# Patient Record
Sex: Female | Born: 1975 | Race: White | Hispanic: No | Marital: Single | Smoking: Current every day smoker
Health system: Southern US, Community
[De-identification: ages and names within clinical notes are randomized; demographics above are authoritative.]

## PROBLEM LIST (undated history)

## (undated) DIAGNOSIS — F419 Anxiety disorder, unspecified: Secondary | ICD-10-CM

## (undated) DIAGNOSIS — F32A Depression, unspecified: Secondary | ICD-10-CM

## (undated) DIAGNOSIS — G43909 Migraine, unspecified, not intractable, without status migrainosus: Secondary | ICD-10-CM

## (undated) DIAGNOSIS — IMO0001 Reserved for inherently not codable concepts without codable children: Secondary | ICD-10-CM

## (undated) HISTORY — DX: Anxiety disorder, unspecified: F41.9

## (undated) HISTORY — DX: Migraine, unspecified, not intractable, without status migrainosus: G43.909

## (undated) HISTORY — PX: TONSILLECTOMY: SUR1361

## (undated) HISTORY — DX: Depression, unspecified: F32.A

---

## 1998-03-25 ENCOUNTER — Other Ambulatory Visit: Admission: RE | Admit: 1998-03-25 | Discharge: 1998-03-25 | Payer: Self-pay | Admitting: Obstetrics

## 2000-07-02 ENCOUNTER — Encounter: Admission: RE | Admit: 2000-07-02 | Discharge: 2000-07-02 | Payer: Self-pay | Admitting: Family Medicine

## 2000-07-02 ENCOUNTER — Encounter: Payer: Self-pay | Admitting: Family Medicine

## 2000-07-17 ENCOUNTER — Encounter: Admission: RE | Admit: 2000-07-17 | Discharge: 2000-07-17 | Payer: Self-pay | Admitting: Family Medicine

## 2000-07-17 ENCOUNTER — Encounter: Payer: Self-pay | Admitting: Family Medicine

## 2001-01-28 ENCOUNTER — Other Ambulatory Visit: Admission: RE | Admit: 2001-01-28 | Discharge: 2001-01-28 | Payer: Self-pay | Admitting: Obstetrics

## 2002-02-22 ENCOUNTER — Encounter: Payer: Self-pay | Admitting: Emergency Medicine

## 2002-02-22 ENCOUNTER — Emergency Department (HOSPITAL_COMMUNITY): Admission: EM | Admit: 2002-02-22 | Discharge: 2002-02-22 | Payer: Self-pay | Admitting: Emergency Medicine

## 2002-04-23 ENCOUNTER — Encounter: Admission: RE | Admit: 2002-04-23 | Discharge: 2002-04-23 | Payer: Self-pay | Admitting: *Deleted

## 2002-05-06 ENCOUNTER — Emergency Department (HOSPITAL_COMMUNITY): Admission: EM | Admit: 2002-05-06 | Discharge: 2002-05-06 | Payer: Self-pay | Admitting: Emergency Medicine

## 2002-05-30 ENCOUNTER — Encounter: Admission: RE | Admit: 2002-05-30 | Discharge: 2002-05-30 | Payer: Self-pay | Admitting: Psychiatry

## 2002-06-04 ENCOUNTER — Encounter: Admission: RE | Admit: 2002-06-04 | Discharge: 2002-06-04 | Payer: Self-pay | Admitting: *Deleted

## 2002-06-27 ENCOUNTER — Encounter: Admission: RE | Admit: 2002-06-27 | Discharge: 2002-06-27 | Payer: Self-pay | Admitting: *Deleted

## 2002-07-23 ENCOUNTER — Encounter: Admission: RE | Admit: 2002-07-23 | Discharge: 2002-07-23 | Payer: Self-pay | Admitting: *Deleted

## 2002-08-11 ENCOUNTER — Encounter: Admission: RE | Admit: 2002-08-11 | Discharge: 2002-08-11 | Payer: Self-pay | Admitting: *Deleted

## 2002-08-26 ENCOUNTER — Encounter: Admission: RE | Admit: 2002-08-26 | Discharge: 2002-08-26 | Payer: Self-pay | Admitting: *Deleted

## 2002-08-29 ENCOUNTER — Encounter: Admission: RE | Admit: 2002-08-29 | Discharge: 2002-08-29 | Payer: Self-pay | Admitting: *Deleted

## 2002-11-05 ENCOUNTER — Encounter: Admission: RE | Admit: 2002-11-05 | Discharge: 2002-11-05 | Payer: Self-pay | Admitting: *Deleted

## 2002-12-11 ENCOUNTER — Encounter: Admission: RE | Admit: 2002-12-11 | Discharge: 2002-12-11 | Payer: Self-pay | Admitting: *Deleted

## 2003-01-27 ENCOUNTER — Encounter: Admission: RE | Admit: 2003-01-27 | Discharge: 2003-01-27 | Payer: Self-pay | Admitting: *Deleted

## 2003-03-10 ENCOUNTER — Emergency Department (HOSPITAL_COMMUNITY): Admission: EM | Admit: 2003-03-10 | Discharge: 2003-03-10 | Payer: Self-pay | Admitting: Emergency Medicine

## 2003-03-27 ENCOUNTER — Encounter: Admission: RE | Admit: 2003-03-27 | Discharge: 2003-03-27 | Payer: Self-pay | Admitting: *Deleted

## 2003-07-30 ENCOUNTER — Encounter: Admission: RE | Admit: 2003-07-30 | Discharge: 2003-07-30 | Payer: Self-pay | Admitting: *Deleted

## 2003-10-03 ENCOUNTER — Emergency Department (HOSPITAL_COMMUNITY): Admission: AD | Admit: 2003-10-03 | Discharge: 2003-10-03 | Payer: Self-pay | Admitting: Family Medicine

## 2004-03-11 ENCOUNTER — Emergency Department (HOSPITAL_COMMUNITY): Admission: EM | Admit: 2004-03-11 | Discharge: 2004-03-11 | Payer: Self-pay | Admitting: Emergency Medicine

## 2004-07-20 ENCOUNTER — Emergency Department (HOSPITAL_COMMUNITY): Admission: EM | Admit: 2004-07-20 | Discharge: 2004-07-20 | Payer: Self-pay | Admitting: Emergency Medicine

## 2004-09-28 ENCOUNTER — Emergency Department (HOSPITAL_COMMUNITY): Admission: EM | Admit: 2004-09-28 | Discharge: 2004-09-28 | Payer: Self-pay | Admitting: Emergency Medicine

## 2005-05-20 ENCOUNTER — Emergency Department (HOSPITAL_COMMUNITY): Admission: EM | Admit: 2005-05-20 | Discharge: 2005-05-20 | Payer: Self-pay | Admitting: Emergency Medicine

## 2005-07-07 ENCOUNTER — Emergency Department (HOSPITAL_COMMUNITY): Admission: EM | Admit: 2005-07-07 | Discharge: 2005-07-07 | Payer: Self-pay | Admitting: Emergency Medicine

## 2005-08-14 ENCOUNTER — Emergency Department (HOSPITAL_COMMUNITY): Admission: EM | Admit: 2005-08-14 | Discharge: 2005-08-14 | Payer: Self-pay | Admitting: Emergency Medicine

## 2005-08-29 ENCOUNTER — Emergency Department (HOSPITAL_COMMUNITY): Admission: EM | Admit: 2005-08-29 | Discharge: 2005-08-29 | Payer: Self-pay | Admitting: Emergency Medicine

## 2007-02-09 ENCOUNTER — Inpatient Hospital Stay (HOSPITAL_COMMUNITY): Admission: AD | Admit: 2007-02-09 | Discharge: 2007-02-09 | Payer: Self-pay | Admitting: Obstetrics

## 2007-04-30 ENCOUNTER — Ambulatory Visit (HOSPITAL_COMMUNITY): Admission: RE | Admit: 2007-04-30 | Discharge: 2007-04-30 | Payer: Self-pay | Admitting: Obstetrics

## 2007-05-19 ENCOUNTER — Inpatient Hospital Stay (HOSPITAL_COMMUNITY): Admission: AD | Admit: 2007-05-19 | Discharge: 2007-05-19 | Payer: Self-pay | Admitting: Obstetrics

## 2007-06-06 ENCOUNTER — Inpatient Hospital Stay (HOSPITAL_COMMUNITY): Admission: AD | Admit: 2007-06-06 | Discharge: 2007-06-06 | Payer: Self-pay | Admitting: Obstetrics

## 2007-06-20 ENCOUNTER — Inpatient Hospital Stay (HOSPITAL_COMMUNITY): Admission: AD | Admit: 2007-06-20 | Discharge: 2007-06-20 | Payer: Self-pay | Admitting: Obstetrics

## 2007-09-13 ENCOUNTER — Inpatient Hospital Stay (HOSPITAL_COMMUNITY): Admission: AD | Admit: 2007-09-13 | Discharge: 2007-09-13 | Payer: Self-pay | Admitting: Obstetrics

## 2007-09-14 ENCOUNTER — Inpatient Hospital Stay (HOSPITAL_COMMUNITY): Admission: AD | Admit: 2007-09-14 | Discharge: 2007-09-16 | Payer: Self-pay | Admitting: Obstetrics

## 2007-10-25 ENCOUNTER — Emergency Department (HOSPITAL_COMMUNITY): Admission: EM | Admit: 2007-10-25 | Discharge: 2007-10-25 | Payer: Self-pay | Admitting: Emergency Medicine

## 2009-10-13 ENCOUNTER — Emergency Department (HOSPITAL_COMMUNITY): Admission: EM | Admit: 2009-10-13 | Discharge: 2009-10-13 | Payer: Self-pay | Admitting: Emergency Medicine

## 2011-04-29 ENCOUNTER — Emergency Department (HOSPITAL_COMMUNITY)
Admission: EM | Admit: 2011-04-29 | Discharge: 2011-04-29 | Disposition: A | Discharge status: Home / Self Care | Payer: Self-pay | Attending: Emergency Medicine | Admitting: Emergency Medicine

## 2011-04-29 DIAGNOSIS — X503XXA Overexertion from repetitive movements, initial encounter: Secondary | ICD-10-CM | POA: Insufficient documentation

## 2011-04-29 DIAGNOSIS — M25519 Pain in unspecified shoulder: Secondary | ICD-10-CM | POA: Insufficient documentation

## 2011-04-29 DIAGNOSIS — R209 Unspecified disturbances of skin sensation: Secondary | ICD-10-CM | POA: Insufficient documentation

## 2011-04-29 DIAGNOSIS — M545 Low back pain, unspecified: Secondary | ICD-10-CM | POA: Insufficient documentation

## 2011-04-29 DIAGNOSIS — S335XXA Sprain of ligaments of lumbar spine, initial encounter: Secondary | ICD-10-CM | POA: Insufficient documentation

## 2011-04-29 DIAGNOSIS — Y990 Civilian activity done for income or pay: Secondary | ICD-10-CM | POA: Insufficient documentation

## 2011-04-29 DIAGNOSIS — IMO0002 Reserved for concepts with insufficient information to code with codable children: Secondary | ICD-10-CM | POA: Insufficient documentation

## 2011-04-29 DIAGNOSIS — R079 Chest pain, unspecified: Secondary | ICD-10-CM | POA: Insufficient documentation

## 2011-04-29 DIAGNOSIS — M542 Cervicalgia: Secondary | ICD-10-CM | POA: Insufficient documentation

## 2011-05-18 ENCOUNTER — Emergency Department (HOSPITAL_COMMUNITY): Payer: Self-pay

## 2011-05-18 ENCOUNTER — Emergency Department (HOSPITAL_COMMUNITY)
Admission: EM | Admit: 2011-05-18 | Discharge: 2011-05-18 | Disposition: A | Discharge status: Home / Self Care | Payer: Self-pay | Attending: Emergency Medicine | Admitting: Emergency Medicine

## 2011-05-18 DIAGNOSIS — M545 Low back pain, unspecified: Secondary | ICD-10-CM | POA: Insufficient documentation

## 2011-05-18 DIAGNOSIS — M5430 Sciatica, unspecified side: Secondary | ICD-10-CM | POA: Insufficient documentation

## 2011-05-18 DIAGNOSIS — M79609 Pain in unspecified limb: Secondary | ICD-10-CM | POA: Insufficient documentation

## 2011-05-18 DIAGNOSIS — IMO0002 Reserved for concepts with insufficient information to code with codable children: Secondary | ICD-10-CM | POA: Insufficient documentation

## 2011-06-09 ENCOUNTER — Emergency Department (HOSPITAL_COMMUNITY)
Admission: EM | Admit: 2011-06-09 | Discharge: 2011-06-09 | Disposition: A | Discharge status: Home / Self Care | Payer: Worker's Compensation | Attending: Emergency Medicine | Admitting: Emergency Medicine

## 2011-06-09 DIAGNOSIS — Y9289 Other specified places as the place of occurrence of the external cause: Secondary | ICD-10-CM | POA: Insufficient documentation

## 2011-06-09 DIAGNOSIS — S239XXA Sprain of unspecified parts of thorax, initial encounter: Secondary | ICD-10-CM | POA: Insufficient documentation

## 2011-06-09 DIAGNOSIS — M546 Pain in thoracic spine: Secondary | ICD-10-CM | POA: Insufficient documentation

## 2011-06-09 DIAGNOSIS — X500XXA Overexertion from strenuous movement or load, initial encounter: Secondary | ICD-10-CM | POA: Insufficient documentation

## 2011-08-11 LAB — I-STAT 8, (EC8 V) (CONVERTED LAB)
Acid-base deficit: 1
BUN: 3 — ABNORMAL LOW
Bicarbonate: 24.2 — ABNORMAL HIGH
Chloride: 107
Glucose, Bld: 75
HCT: 38
Hemoglobin: 12.9
Operator id: 161631
Potassium: 4.2
Sodium: 136
TCO2: 25
pCO2, Ven: 39.5 — ABNORMAL LOW
pH, Ven: 7.394 — ABNORMAL HIGH

## 2011-08-11 LAB — DIFFERENTIAL
Basophils Absolute: 0.1
Basophils Relative: 1
Eosinophils Absolute: 0.1 — ABNORMAL LOW
Eosinophils Relative: 1
Lymphocytes Relative: 32
Lymphs Abs: 3.3
Monocytes Absolute: 0.7
Monocytes Relative: 7
Neutro Abs: 6
Neutrophils Relative %: 59

## 2011-08-11 LAB — POCT I-STAT CREATININE
Creatinine, Ser: 0.8
Operator id: 161631

## 2011-08-11 LAB — CBC
HCT: 36
Hemoglobin: 12.3
MCHC: 34.1
MCV: 94.7
Platelets: 411 — ABNORMAL HIGH
RBC: 3.8 — ABNORMAL LOW
RDW: 14.1
WBC: 10.2

## 2011-08-15 LAB — CBC
HCT: 26.3 — ABNORMAL LOW
HCT: 26.9 — ABNORMAL LOW
Hemoglobin: 9.5 — ABNORMAL LOW
Hemoglobin: 9.6 — ABNORMAL LOW
MCHC: 35.8
MCHC: 36.2 — ABNORMAL HIGH
MCV: 95.9
MCV: 96.8
Platelets: 279
Platelets: 290
RBC: 2.75 — ABNORMAL LOW
RBC: 2.78 — ABNORMAL LOW
RDW: 13.4
RDW: 13.7
WBC: 29.6 — ABNORMAL HIGH
WBC: 34.1 — ABNORMAL HIGH

## 2011-08-15 LAB — RPR: RPR Ser Ql: NONREACTIVE

## 2011-08-21 LAB — URINALYSIS, ROUTINE W REFLEX MICROSCOPIC
Bilirubin Urine: NEGATIVE
Glucose, UA: NEGATIVE
Hgb urine dipstick: NEGATIVE
Ketones, ur: NEGATIVE
Nitrite: NEGATIVE
Protein, ur: NEGATIVE
Specific Gravity, Urine: <1.005 — ABNORMAL LOW
Urobilinogen, UA: 0.2
pH: 6

## 2011-08-22 LAB — URINALYSIS, ROUTINE W REFLEX MICROSCOPIC
Bilirubin Urine: NEGATIVE
Glucose, UA: NEGATIVE
Hgb urine dipstick: NEGATIVE
Ketones, ur: NEGATIVE
Nitrite: NEGATIVE
Protein, ur: NEGATIVE
Specific Gravity, Urine: <1.005 — ABNORMAL LOW
Urobilinogen, UA: 0.2
pH: 5.5

## 2011-08-22 LAB — URINE MICROSCOPIC-ADD ON

## 2011-11-24 ENCOUNTER — Emergency Department (HOSPITAL_COMMUNITY)
Admission: EM | Admit: 2011-11-24 | Discharge: 2011-11-24 | Disposition: A | Discharge status: Home / Self Care | Payer: Worker's Compensation | Attending: Emergency Medicine | Admitting: Emergency Medicine

## 2011-11-24 ENCOUNTER — Encounter (HOSPITAL_COMMUNITY): Payer: Self-pay | Admitting: *Deleted

## 2011-11-24 DIAGNOSIS — H5710 Ocular pain, unspecified eye: Secondary | ICD-10-CM | POA: Insufficient documentation

## 2011-11-24 DIAGNOSIS — H11419 Vascular abnormalities of conjunctiva, unspecified eye: Secondary | ICD-10-CM | POA: Insufficient documentation

## 2011-11-24 DIAGNOSIS — S058X9A Other injuries of unspecified eye and orbit, initial encounter: Secondary | ICD-10-CM | POA: Insufficient documentation

## 2011-11-24 DIAGNOSIS — IMO0002 Reserved for concepts with insufficient information to code with codable children: Secondary | ICD-10-CM | POA: Insufficient documentation

## 2011-11-24 DIAGNOSIS — H53149 Visual discomfort, unspecified: Secondary | ICD-10-CM | POA: Insufficient documentation

## 2011-11-24 DIAGNOSIS — S0502XA Injury of conjunctiva and corneal abrasion without foreign body, left eye, initial encounter: Secondary | ICD-10-CM

## 2011-11-24 DIAGNOSIS — H5789 Other specified disorders of eye and adnexa: Secondary | ICD-10-CM | POA: Insufficient documentation

## 2011-11-24 MED ORDER — FLUORESCEIN SODIUM 1 MG OP STRP
1.0000 | ORAL_STRIP | Freq: Once | OPHTHALMIC | Status: AC
  Administered 2011-11-24: 1 via OPHTHALMIC
  Filled 2011-11-24 (×2): qty 1

## 2011-11-24 MED ORDER — PROPARACAINE HCL 0.5 % OP SOLN
1.0000 [drp] | OPHTHALMIC | Status: AC
  Administered 2011-11-24: 1 [drp] via OPHTHALMIC
  Filled 2011-11-24 (×2): qty 15

## 2011-11-24 MED ORDER — TOBRAMYCIN 0.3 % OP SOLN: 1.0000 [drp] | OPHTHALMIC | Status: AC

## 2011-11-24 MED ORDER — OXYCODONE-ACETAMINOPHEN 5-325 MG PO TABS: 1.0000 | ORAL_TABLET | ORAL | Status: AC | PRN

## 2011-11-24 NOTE — ED Notes (Signed)
Pt reports accidentally poked self in left eye yesterday. Reports redness, pain, swelling, and increase in tears.

## 2011-11-24 NOTE — ED Notes (Addendum)
Poke self in eye accidentally when washing face. There is swelling, redness to rt. Eye. Cornea irritation and redness.

## 2011-12-01 NOTE — ED Provider Notes (Signed)
History    36 year old female with right eye pain. Acute onset when she accidentally hit herself in the eye with her hand when she was washing her face. Persistent pain since. The pain is increased when looking at bright lights. Persistent tearing. No other drainage noted. Does not use corrective lenses. No fever or chills. No other complaints.  CSN: [id number]  Arrival date & time 11/24/11  1226   First MD Initiated Contact with Patient 11/24/11 1242      Chief Complaint  Patient presents with  . Conjunctivitis    (Consider location/radiation/quality/duration/timing/severity/associated sxs/prior treatment) HPI  No past medical history on file.  No past surgical history on file.  No family history on file.  History  Substance Use Topics  . Smoking status: Current Everyday Smoker  . Smokeless tobacco: Not on file  . Alcohol Use: Yes    OB History    Grav Para Term Preterm Abortions TAB SAB Ect Mult Living                  Review of Systems   Review of symptoms negative unless otherwise noted in HPI.   Allergies  Review of patient's allergies indicates no known allergies.  Home Medications   Current Outpatient Rx  Name Route Sig Dispense Refill  . OXYCODONE-ACETAMINOPHEN 5-325 MG PO TABS Oral Take 1 tablet by mouth every 4 (four) hours as needed for pain. 10 tablet 0  . TOBRAMYCIN SULFATE 0.3 % OP SOLN Left Eye Place 1 drop into the left eye every 2 (two) hours. 5 mL 0    1-2 drops in L eye every 2 hours while awake for 5 ...    BP 117/71  Pulse 77  Temp(Src) 98 F (36.7 C) (Oral)  Resp 18  Ht 5\' 1"  (1.549 m)  Wt 118 lb (53.524 kg)  BMI 22.30 kg/m2  SpO2 100%  LMP 11/09/2011  Physical Exam  Nursing note and vitals reviewed. Constitutional: She appears well-developed and well-nourished. No distress.       Sitting up in bed. hand over her right eye.  HENT:  Head: Normocephalic and atraumatic.  Eyes:       Right eye with injected conjunctiva. Cornea  is clear. There is a corneal abrasion identified with fluoroscein staining. The abrasion is located at approximately the 4:00 position. Negative Seidel test. Patient's pain was relieved with proparacaine. There is no proptosis. Tearing. No purulent discharge. Photophobia. Extraocular muscles are intact. The patient to eye lids and lashes are unremarkable. Periorbital tissues are unremarkable. Left eye within normal limits.  Neck: Neck supple.  Cardiovascular: Normal rate, regular rhythm and normal heart sounds.  Exam reveals no gallop and no friction rub.   No murmur heard. Pulmonary/Chest: Effort normal and breath sounds normal. No respiratory distress.  Musculoskeletal: She exhibits no tenderness.  Neurological: She is alert.  Skin: Skin is warm and dry.  Psychiatric: She has a normal mood and affect. Her behavior is normal. Thought content normal.    ED Course  Procedures (including critical care time)  Labs Reviewed - No data to display No results found.   1. Corneal abrasion, left       MDM  36 year old female with right eye pain. Corneal abrasion on exam. Negative Seidel's test. No evidence of secondary infection. Patient does not wear corrective lenses. Plan ophthalmic antibiotics. As needed pain medication. Ophthalmology followup. Return precautions discussed.        Raeford Razor, MD 12/01/11 [phone]

## 2012-01-10 ENCOUNTER — Encounter (HOSPITAL_COMMUNITY): Payer: Self-pay | Admitting: Emergency Medicine

## 2012-01-10 ENCOUNTER — Emergency Department (HOSPITAL_COMMUNITY)
Admission: EM | Admit: 2012-01-10 | Discharge: 2012-01-10 | Disposition: A | Discharge status: Home / Self Care | Payer: Self-pay | Attending: Emergency Medicine | Admitting: Emergency Medicine

## 2012-01-10 DIAGNOSIS — F17200 Nicotine dependence, unspecified, uncomplicated: Secondary | ICD-10-CM | POA: Insufficient documentation

## 2012-01-10 DIAGNOSIS — M778 Other enthesopathies, not elsewhere classified: Secondary | ICD-10-CM

## 2012-01-10 DIAGNOSIS — M65839 Other synovitis and tenosynovitis, unspecified forearm: Secondary | ICD-10-CM | POA: Insufficient documentation

## 2012-01-10 DIAGNOSIS — M65849 Other synovitis and tenosynovitis, unspecified hand: Secondary | ICD-10-CM | POA: Insufficient documentation

## 2012-01-10 DIAGNOSIS — IMO0001 Reserved for inherently not codable concepts without codable children: Secondary | ICD-10-CM | POA: Insufficient documentation

## 2012-01-10 HISTORY — DX: Reserved for inherently not codable concepts without codable children: IMO0001

## 2012-01-10 MED ORDER — PREDNISONE 20 MG PO TABS: 40.0000 mg | ORAL_TABLET | Freq: Once | ORAL | Status: AC

## 2012-01-10 MED ORDER — PREDNISONE 20 MG PO TABS
40.0000 mg | ORAL_TABLET | Freq: Once | ORAL | Status: AC
  Administered 2012-01-10: 40 mg via ORAL
  Filled 2012-01-10: qty 2

## 2012-01-10 NOTE — ED Provider Notes (Signed)
History     CSN: [id number]  Arrival date & time 01/10/12  2049   First MD Initiated Contact with Patient 01/10/12 2108      Chief Complaint  Patient presents with  . Wrist Pain    (Consider location/radiation/quality/duration/timing/severity/associated sxs/prior treatment) HPI Comments: Patient is having pain in the right thumb with movement.  It radiates to the mid forearm.  She is right-handed.  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  The past several days.  She has been wearing an Ace wrap without much relief  Patient is a 36 y.o. female presenting with wrist pain. The history is provided by the patient.  Wrist Pain This is a recurrent problem. The current episode started in the past 7 days. The problem occurs constantly. The problem has been gradually worsening. Pertinent negatives include no arthralgias, joint swelling or rash.    Past Medical History  Diagnosis Date  . No significant past medical history     History reviewed. No pertinent past surgical history.  History reviewed. No pertinent family history.  History  Substance Use Topics  . Smoking status: Current Everyday Smoker  . Smokeless tobacco: Not on file  . Alcohol Use: Yes    OB History    Grav Para Term Preterm Abortions TAB SAB Ect Mult Living                  Review of Systems  Musculoskeletal: Negative for joint swelling and arthralgias.  Skin: Negative for rash and wound.    Allergies  Review of patient's allergies indicates no known allergies.  Home Medications   Current Outpatient Rx  Name Route Sig Dispense Refill  . PREDNISONE 20 MG PO TABS Oral Take 2 tablets (40 mg total) by mouth once. 20 tablet 0    BP 106/55  Pulse 86  Temp(Src) 98.3 F (36.8 C) (Oral)  Resp 20  SpO2 100%  LMP 01/06/2012  Physical Exam  Constitutional: She is oriented to person, place, and time. She appears well-developed and  well-nourished.  HENT:  Head: Normocephalic.  Neck: Normal range of motion.  Cardiovascular: Normal rate.   Musculoskeletal:       Arms: Neurological: She is alert and oriented to person, place, and time.  Skin: Skin is warm.    ED Course  Procedures (including critical care time)  Labs Reviewed - No data to display No results found.   1. Tendinitis of thumb       MDM  From the exam it appears to be tendinitis.  We'll place in a Velcro thumb spica and a short course of steroids 40 mg daily for 10 days.  Patient sees no significant improvement.  Have referred her to Dr. Park Pope for further evaluation after the ten-day trial.        Arman Filter, NP 01/10/12 2200  Arman Filter, NP 01/10/12 2201

## 2012-01-10 NOTE — Discharge Instructions (Signed)
As discussed wear the splint as much as she can for the next [redacted] weeks along with taking the steroids as directed once daily if you do not see significant decrease in your pain please call Dr. Ronie Spies office and make an appointment for further evaluation

## 2012-01-10 NOTE — ED Notes (Signed)
Patient complaining of right wrist pain since Tuesday; patient states that she uses her hand a lot at work; denies injury to wrist.  Patient states that pain radiates up her arm; reports history of carpel tunnel.

## 2012-01-10 NOTE — Progress Notes (Signed)
Orthopedic Tech Progress Note Patient Details:  Heather Cuevas 1976/10/08 [id number]  Type of Splint: Thumb velcro Splint Location: (R) UE Splint Interventions: Application    Jennye Moccasin 01/10/2012, 10:02 PM

## 2012-01-11 NOTE — ED Provider Notes (Signed)
Medical screening examination/treatment/procedure(s) were performed by non-physician practitioner and as supervising physician I was immediately available for consultation/collaboration.   Laray Anger, DO 01/11/12 1400

## 2014-04-08 ENCOUNTER — Encounter (HOSPITAL_COMMUNITY): Payer: Self-pay | Admitting: Emergency Medicine

## 2014-04-08 ENCOUNTER — Emergency Department (HOSPITAL_COMMUNITY)
Admission: EM | Admit: 2014-04-08 | Discharge: 2014-04-08 | Disposition: A | Discharge status: Home / Self Care | Payer: Managed Care, Other (non HMO) | Attending: Emergency Medicine | Admitting: Emergency Medicine

## 2014-04-08 DIAGNOSIS — Z3202 Encounter for pregnancy test, result negative: Secondary | ICD-10-CM | POA: Insufficient documentation

## 2014-04-08 DIAGNOSIS — Z79899 Other long term (current) drug therapy: Secondary | ICD-10-CM | POA: Insufficient documentation

## 2014-04-08 DIAGNOSIS — G43909 Migraine, unspecified, not intractable, without status migrainosus: Secondary | ICD-10-CM | POA: Insufficient documentation

## 2014-04-08 DIAGNOSIS — F17200 Nicotine dependence, unspecified, uncomplicated: Secondary | ICD-10-CM | POA: Insufficient documentation

## 2014-04-08 LAB — URINALYSIS, ROUTINE W REFLEX MICROSCOPIC
Bilirubin Urine: NEGATIVE
Glucose, UA: NEGATIVE mg/dL
Hgb urine dipstick: NEGATIVE
Ketones, ur: NEGATIVE mg/dL
Leukocytes, UA: NEGATIVE
Nitrite: NEGATIVE
Protein, ur: NEGATIVE mg/dL
Specific Gravity, Urine: 1.008 (ref 1.005–1.030)
Urobilinogen, UA: 0.2 mg/dL (ref 0.0–1.0)
pH: 6 (ref 5.0–8.0)

## 2014-04-08 LAB — PREGNANCY, URINE: Preg Test, Ur: NEGATIVE

## 2014-04-08 MED ORDER — METOCLOPRAMIDE HCL 5 MG/ML IJ SOLN
10.0000 mg | Freq: Once | INTRAMUSCULAR | Status: AC
  Administered 2014-04-08: 10 mg via INTRAMUSCULAR
  Filled 2014-04-08: qty 2

## 2014-04-08 MED ORDER — KETOROLAC TROMETHAMINE 60 MG/2ML IM SOLN
60.0000 mg | Freq: Once | INTRAMUSCULAR | Status: AC
  Administered 2014-04-08: 60 mg via INTRAMUSCULAR
  Filled 2014-04-08: qty 2

## 2014-04-08 MED ORDER — DIPHENHYDRAMINE HCL 25 MG PO CAPS
25.0000 mg | ORAL_CAPSULE | Freq: Once | ORAL | Status: AC
  Administered 2014-04-08: 25 mg via ORAL
  Filled 2014-04-08: qty 1

## 2014-04-08 MED ORDER — BUTALBITAL-APAP-CAFFEINE 50-325-40 MG PO TABS: 1.0000 | ORAL_TABLET | Freq: Four times a day (QID) | ORAL | Status: AC | PRN

## 2014-04-08 NOTE — ED Notes (Signed)
The pt has had as headache since Saturday and some back pain today.  lmp may 15th

## 2014-04-08 NOTE — Discharge Instructions (Signed)
Migraine Headache A migraine headache is an intense, throbbing pain on one or both sides of your head. A migraine can last for 30 minutes to several hours. CAUSES  The exact cause of a migraine headache is not always known. However, a migraine may be caused when nerves in the brain become irritated and release chemicals that cause inflammation. This causes pain. Certain things may also trigger migraines, such as:  Alcohol.  Smoking.  Stress.  Menstruation.  Aged cheeses.  Foods or drinks that contain nitrates, glutamate, aspartame, or tyramine.  Lack of sleep.  Chocolate.  Caffeine.  Hunger.  Physical exertion.  Fatigue.  Medicines used to treat chest pain (nitroglycerine), birth control pills, estrogen, and some blood pressure medicines. SIGNS AND SYMPTOMS  Pain on one or both sides of your head.  Pulsating or throbbing pain.  Severe pain that prevents daily activities.  Pain that is aggravated by any physical activity.  Nausea, vomiting, or both.  Dizziness.  Pain with exposure to bright lights, loud noises, or activity.  General sensitivity to bright lights, loud noises, or smells. Before you get a migraine, you may get warning signs that a migraine is coming (aura). An aura may include:  Seeing flashing lights.  Seeing bright spots, halos, or zig-zag lines.  Having tunnel vision or blurred vision.  Having feelings of numbness or tingling.  Having trouble talking.  Having muscle weakness. DIAGNOSIS  A migraine headache is often diagnosed based on:  Symptoms.  Physical exam.  A CT scan or MRI of your head. These imaging tests cannot diagnose migraines, but they can help rule out other causes of headaches. TREATMENT Medicines may be given for pain and nausea. Medicines can also be given to help prevent recurrent migraines.  HOME CARE INSTRUCTIONS  Only take over-the-counter or prescription medicines for pain or discomfort as directed by your  health care provider. The use of long-term narcotics is not recommended.  Lie down in a dark, quiet room when you have a migraine.  Keep a journal to find out what may trigger your migraine headaches. For example, write down:  What you eat and drink.  How much sleep you get.  Any change to your diet or medicines.  Limit alcohol consumption.  Quit smoking if you smoke.  Get 7 9 hours of sleep, or as recommended by your health care provider.  Limit stress.  Keep lights dim if bright lights bother you and make your migraines worse. SEEK IMMEDIATE MEDICAL CARE IF:   Your migraine becomes severe.  You have a fever.  You have a stiff neck.  You have vision loss.  You have muscular weakness or loss of muscle control.  You start losing your balance or have trouble walking.  You feel faint or pass out.  You have severe symptoms that are different from your first symptoms. MAKE SURE YOU:   Understand these instructions.  Will watch your condition.  Will get help right away if you are not doing well or get worse. Document Released: 10/23/2005 Document Revised: 08/13/2013 Document Reviewed: 06/30/2013 [employer] Patient Information 2014 [employer].  

## 2014-04-08 NOTE — ED Provider Notes (Signed)
CSN: [id number]     Arrival date & time 04/08/14  1648 History   First MD Initiated Contact with Patient 04/08/14 1820     Chief Complaint  Patient presents with  . Migraine     (Consider location/radiation/quality/duration/timing/severity/associated sxs/prior Treatment) Patient is a 38 y.o. female presenting with migraines. The history is provided by the patient.  Migraine This is a recurrent problem. Episode onset: Headache intermittently for 4 days. The problem occurs intermittently. The problem has been unchanged. Associated symptoms include headaches and nausea. Pertinent negatives include no abdominal pain, anorexia, arthralgias, change in bowel habit, chest pain, chills, congestion, fever, joint swelling, neck pain, numbness, rash, sore throat, swollen glands, urinary symptoms, vertigo, visual change, vomiting or weakness. Exacerbated by: Loud noises, bright lights certain movements. She has tried NSAIDs, rest and acetaminophen for the symptoms. The treatment provided no relief.   Patient with history of recurrent migraine headaches complains of waxing and waning headaches for 4 days. She states the headache this morning has gradually increased throughout the day. Patient states that she has taken over-the-counter Tylenol, Excedrin Migraine, ibuprofen without improvement. Today, she states that she tried a Flexeril without relief. Patient states the headache today is similar to previous. She also reports having a stressful job and consuming large amounts of caffeine. Denies fever, neck pain or stiffness, or vomiting, chest pain or abdominal pain.  Patient also complains of mid back pain that she also states is a recurrent problem. And back pain is worse with movement and improves with rest. She denies any radiation of pain to her upper or lower extremities, incontinence of urine or feces. She also denies any dysuria, weakness or numbness of the extremities  Past Medical History  Diagnosis Date   . No significant past medical history    History reviewed. No pertinent past surgical history. No family history on file. History  Substance Use Topics  . Smoking status: Current Every Day Smoker  . Smokeless tobacco: Not on file  . Alcohol Use: Yes   OB History   Grav Para Term Preterm Abortions TAB SAB Ect Mult Living                 Review of Systems  Constitutional: Negative for fever, chills, activity change and appetite change.  HENT: Negative for congestion, facial swelling, sore throat and trouble swallowing.   Eyes: Positive for photophobia. Negative for pain and visual disturbance.  Respiratory: Negative for shortness of breath.   Cardiovascular: Negative for chest pain.  Gastrointestinal: Positive for nausea. Negative for vomiting, abdominal pain, anorexia and change in bowel habit.  Genitourinary: Negative for dysuria and flank pain.  Musculoskeletal: Negative for arthralgias, joint swelling, neck pain and neck stiffness.  Skin: Negative for rash and wound.  Neurological: Positive for headaches. Negative for dizziness, vertigo, syncope, facial asymmetry, speech difficulty, weakness, light-headedness and numbness.  Psychiatric/Behavioral: Negative for confusion and decreased concentration.  All other systems reviewed and are negative.     Allergies  Review of patient's allergies indicates no known allergies.  Home Medications   Prior to Admission medications   Medication Sig Start Date End Date Taking? Authorizing Provider  cyclobenzaprine (FLEXERIL) 10 MG tablet Take 10 mg by mouth 3 (three) times daily as needed for muscle spasms.   Yes Historical Provider, MD   BP 121/72  Pulse 76  Temp(Src) 98.1 F (36.7 C) (Oral)  Resp 16  Ht 5\' 1"  (1.549 m)  Wt 136 lb (61.689 kg)  BMI 25.71 kg/m2  SpO2 98%  LMP 03/20/2014 Physical Exam  Nursing note and vitals reviewed. Constitutional: She is oriented to person, place, and time. She appears well-developed and  well-nourished. No distress.  HENT:  Head: Normocephalic and atraumatic.  Mouth/Throat: Oropharynx is clear and moist.  Eyes: EOM are normal. Pupils are equal, round, and reactive to light.  Neck: Normal range of motion and phonation normal. Neck supple. No spinous process tenderness and no muscular tenderness present. No rigidity. No Brudzinski's sign and no Kernig's sign noted.  Cardiovascular: Normal rate, regular rhythm, normal heart sounds and intact distal pulses.   No murmur heard. Pulmonary/Chest: Effort normal and breath sounds normal. No respiratory distress. She exhibits no tenderness.  Musculoskeletal: Normal range of motion. She exhibits tenderness. She exhibits no edema.  Diffuse tenderness to palpation of the thoracic and upper lumbar paraspinal muscles. No spinal tenderness. Patient has 5 out of 5 strength against resistance of the bilateral upper and lower extremities.    Neurological: She is alert and oriented to person, place, and time. She has normal strength. No cranial nerve deficit or sensory deficit. She exhibits normal muscle tone. Coordination and gait normal. GCS eye subscore is 4. GCS verbal subscore is 5. GCS motor subscore is 6.  Reflex Scores:      Tricep reflexes are 2+ on the right side and 2+ on the left side.      Bicep reflexes are 2+ on the right side and 2+ on the left side.      Patellar reflexes are 2+ on the right side and 2+ on the left side.      Achilles reflexes are 2+ on the right side and 2+ on the left side. Skin: Skin is warm and dry.  Psychiatric: She has a normal mood and affect.    ED Course  Procedures (including critical care time) Labs Review Labs Reviewed  URINALYSIS, ROUTINE W REFLEX MICROSCOPIC  PREGNANCY, URINE    Imaging Review No results found.   EKG Interpretation None      MDM   Final diagnoses:  None    Vital signs are stable. Patient is well-appearing and nontoxic. Headache today is similar to previous. No  concerning symptoms for meningitis or SAH.  No focal neuro deficits on exam.  Patient is ambulatory  Patient agrees to close followup with her primary care doctor, patient will also be given referral information and #10 Fioricet for headache pain. She appears stable for discharge   On recheck, patient is feeling better. Requesting discharge .  Give her referral information   Jaena Brocato L. Trisha Mangleriplett, PA-C 04/08/14 2042

## 2014-04-12 NOTE — ED Provider Notes (Signed)
Medical screening examination/treatment/procedure(s) were performed by non-physician practitioner and as supervising physician I was immediately available for consultation/collaboration.   EKG Interpretation None        Vanetta Mulders, MD 04/12/14 1140

## 2015-07-30 ENCOUNTER — Encounter (HOSPITAL_COMMUNITY): Payer: Self-pay | Admitting: *Deleted

## 2015-07-30 ENCOUNTER — Emergency Department (HOSPITAL_COMMUNITY)
Admission: EM | Admit: 2015-07-30 | Discharge: 2015-07-30 | Disposition: A | Discharge status: Home / Self Care | Payer: Worker's Compensation | Attending: Emergency Medicine | Admitting: Emergency Medicine

## 2015-07-30 DIAGNOSIS — S0992XA Unspecified injury of nose, initial encounter: Secondary | ICD-10-CM

## 2015-07-30 DIAGNOSIS — Y9389 Activity, other specified: Secondary | ICD-10-CM | POA: Insufficient documentation

## 2015-07-30 DIAGNOSIS — Z720 Tobacco use: Secondary | ICD-10-CM | POA: Insufficient documentation

## 2015-07-30 DIAGNOSIS — Y998 Other external cause status: Secondary | ICD-10-CM | POA: Insufficient documentation

## 2015-07-30 DIAGNOSIS — Y9289 Other specified places as the place of occurrence of the external cause: Secondary | ICD-10-CM | POA: Insufficient documentation

## 2015-07-30 DIAGNOSIS — S0990XA Unspecified injury of head, initial encounter: Secondary | ICD-10-CM | POA: Insufficient documentation

## 2015-07-30 DIAGNOSIS — W2209XA Striking against other stationary object, initial encounter: Secondary | ICD-10-CM | POA: Insufficient documentation

## 2015-07-30 MED ORDER — HYDROCODONE-ACETAMINOPHEN 5-325 MG PO TABS: 1.0000 | ORAL_TABLET | Freq: Four times a day (QID) | ORAL | Status: DC | PRN

## 2015-07-30 MED ORDER — NAPROXEN 375 MG PO TABS: 375.0000 mg | ORAL_TABLET | Freq: Two times a day (BID) | ORAL | Status: DC

## 2015-07-30 NOTE — ED Provider Notes (Signed)
CSN: [id number]     Arrival date & time 07/30/15  1015 History  This chart was scribed for non-physician practitioner Roxy Horseman, PA-C, working with Lorre Nick, MD, by Tanda Rockers, ED Scribe. This patient was seen in room TR05C/TR05C and the patient's care was started at 10:44 AM.  Chief Complaint  Patient presents with  . Facial Injury   The history is provided by the patient. No language interpreter was used.     HPI Comments: Heather Cuevas is a 39 y.o. female who presents to the Emergency Department complaining of sudden onset, constant, 7/10, pressure, bridge of nose pain x 10 days. Pt states that she accidentally ran into a metal door, causing pain. No LOC. She notes bruising for the past 2-3 days. She has applied ice and taking Excedrin without relief. She denies any other associated symptoms.    Past Medical History  Diagnosis Date  . No significant past medical history    History reviewed. No pertinent past surgical history. No family history on file. Social History  Substance Use Topics  . Smoking status: Current Every Day Smoker  . Smokeless tobacco: None  . Alcohol Use: Yes   OB History    No data available     Review of Systems  HENT: Positive for congestion and facial swelling.        Nasal bridge pain  Eyes: Negative for pain and visual disturbance.  Skin: Negative for wound.  Neurological: Positive for headaches. Negative for dizziness, syncope and light-headedness.   Allergies  Review of patient's allergies indicates no known allergies.  Home Medications   Prior to Admission medications   Medication Sig Start Date End Date Taking? Authorizing Provider  cyclobenzaprine (FLEXERIL) 10 MG tablet Take 10 mg by mouth 3 (three) times daily as needed for muscle spasms.    Historical Provider, MD   Triage Vitals: BP 137/66 mmHg  Pulse 76  Temp(Src) 98 F (36.7 C) (Oral)  Resp 16  SpO2 97%   Physical Exam  Constitutional: She is oriented to  person, place, and time. She appears well-developed and well-nourished. No distress.  HENT:  Head: Normocephalic and atraumatic.  Very mild periorbital bruising with mild nasal bone tenderness, both nares are patent, no obvious deformity or fracture  Eyes: Conjunctivae and EOM are normal.  Neck: Neck supple. No tracheal deviation present.  Cardiovascular: Normal rate.   Pulmonary/Chest: Effort normal. No respiratory distress.  Musculoskeletal: Normal range of motion.  Neurological: She is alert and oriented to person, place, and time.  Skin: Skin is warm and dry.  Psychiatric: She has a normal mood and affect. Her behavior is normal.  Nursing note and vitals reviewed.   ED Course  Procedures (including critical care time)  DIAGNOSTIC STUDIES: Oxygen Saturation is 97% on RA, normal by my interpretation.    COORDINATION OF CARE: 10:56 AM- Discussed treatment plan which includes Rx anti-inflammatory and consult with social worker with pt at bedside and pt agreed to plan.     MDM   Final diagnoses:  Nose injury, initial encounter   Patient with nose injury about one week ago. Possible small fracture, however imaging was offered to the patient, but was deferred. Patient states that she does not have the money to pay for it. Even if there is small fracture, there is likely no surgical indication for repair, as there is no obvious displacement, and bilateral nares are patent. The injury happened about a week ago. I have the case manager  and social worker see the patient, who have arranged for follow-up for the patient. I have also advised further see an ear nose and throat in to see if her symptoms are not improving. Patient understands and agrees with plan. She is stable and ready for discharge.   I personally performed the services described in this documentation, which was scribed in my presence. The recorded information has been reviewed and is accurate.        Roxy Horseman,  PA-C 07/31/15 1616  Lorre Nick, MD 08/03/15 1245

## 2015-07-30 NOTE — Discharge Instructions (Signed)
Nasal Fracture A nasal fracture is a break or crack in the bones of the nose. A minor break usually heals in a month. You often will receive black eyes from a nasal fracture. This is not a cause for concern. The black eyes will go away over 1 to 2 weeks.  DIAGNOSIS  Your caregiver may want to examine you if you are concerned about a fracture of the nose. X-rays of the nose may not show a nasal fracture even when one is present. Sometimes your caregiver must wait 1 to 5 days after the injury to re-check the nose for alignment and to take additional X-rays. Sometimes the caregiver must wait until the swelling has gone down. TREATMENT Minor fractures that have caused no deformity often do not require treatment. More serious fractures where bones are displaced may require surgery. This will take place after the swelling is gone. Surgery will stabilize and align the fracture. HOME CARE INSTRUCTIONS   Put ice on the injured area.  Put ice in a plastic bag.  Place a towel between your skin and the bag.  Leave the ice on for 15-20 minutes, 03-04 times a day.  Take medications as directed by your caregiver.  Only take over-the-counter or prescription medicines for pain, discomfort, or fever as directed by your caregiver.  If your nose starts bleeding, squeeze the soft parts of the nose against the center wall while you are sitting in an upright position for 10 minutes.  Contact sports should be avoided for at least 3 to 4 weeks or as directed by your caregiver. SEEK MEDICAL CARE IF:  Your pain increases or becomes severe.  You continue to have nosebleeds.  The shape of your nose does not return to normal within 5 days.  You have pus draining from the nose. SEEK IMMEDIATE MEDICAL CARE IF:   You have bleeding from your nose that does not stop after 20 minutes of pinching the nostrils closed and keeping ice on the nose.  You have clear fluid draining from your nose.  You notice a grape-like  swelling on the dividing wall between the nostrils (septum). This is a collection of blood (hematoma) that must be drained to help prevent infection.  You have difficulty moving your eyes.  You have recurrent vomiting. Document Released: 10/20/2000 Document Revised: 01/15/2012 Document Reviewed: 02/06/2011 [employer] Patient Information 2015 [employer]. This information is not intended to replace advice given to you by your health care provider. Make sure you discuss any questions you have with your health care provider.  

## 2015-07-30 NOTE — ED Notes (Signed)
Pt states that she ran into a metal door one week ago. Pt reports pain and swelling to her nose. No bruising noted.

## 2017-04-03 ENCOUNTER — Encounter (HOSPITAL_COMMUNITY): Payer: Self-pay | Admitting: *Deleted

## 2017-04-03 ENCOUNTER — Ambulatory Visit (HOSPITAL_COMMUNITY)
Admission: EM | Admit: 2017-04-03 | Discharge: 2017-04-03 | Disposition: A | Discharge status: Home / Self Care | Payer: Managed Care, Other (non HMO) | Attending: Family Medicine | Admitting: Family Medicine

## 2017-04-03 ENCOUNTER — Ambulatory Visit (INDEPENDENT_AMBULATORY_CARE_PROVIDER_SITE_OTHER): Payer: Managed Care, Other (non HMO)

## 2017-04-03 ENCOUNTER — Telehealth: Payer: Self-pay

## 2017-04-03 DIAGNOSIS — S92501A Displaced unspecified fracture of right lesser toe(s), initial encounter for closed fracture: Secondary | ICD-10-CM | POA: Diagnosis not present

## 2017-04-03 MED ORDER — HYDROCODONE-ACETAMINOPHEN 5-325 MG PO TABS: 1.0000 | ORAL_TABLET | ORAL | 0 refills | Status: DC | PRN

## 2017-04-03 NOTE — Discharge Instructions (Signed)
Keep your toes taped together. Wear the hard sole shoe when walking. For the next to 3 days keep it elevated as much as she can and apply ice.

## 2017-04-03 NOTE — ED Provider Notes (Signed)
CSN: [id number]     Arrival date & time 04/03/17  1325 History   First MD Initiated Contact with Patient 04/03/17 1536     Chief Complaint  Patient presents with  . Toe Injury   (Consider location/radiation/quality/duration/timing/severity/associated sxs/prior Treatment) 41 year old female awoke this morning and while walking to another room she stubbed her right second toe against the doorjamb. She states that it was a straight on strike. She developed pain and throbbing. Later he began to turn blue and purple. This occurred this morning around 6 AM at home.      Past Medical History:  Diagnosis Date  . No significant past medical history    History reviewed. No pertinent surgical history. History reviewed. No pertinent family history. Social History  Substance Use Topics  . Smoking status: Current Every Day Smoker  . Smokeless tobacco: Not on file  . Alcohol use Yes   OB History    No data available     Review of Systems  Constitutional: Negative for activity change, chills and fever.  HENT: Negative.   Respiratory: Negative.   Musculoskeletal:       As per HPI  Skin: Negative for color change, pallor and rash.  Neurological: Negative.   All other systems reviewed and are negative.   Allergies  Patient has no known allergies.  Home Medications   Prior to Admission medications   Medication Sig Start Date End Date Taking? Authorizing Provider  HYDROcodone-acetaminophen (NORCO/VICODIN) 5-325 MG tablet Take 1 tablet by mouth every 4 (four) hours as needed. 04/03/17   Hayden Rasmussen, NP   Meds Ordered and Administered this Visit  Medications - No data to display  BP 130/72 (BP Location: Right Arm)   Pulse 72   Temp 98.6 F (37 C) (Oral)   Resp 18   SpO2 100%  No data found.   Physical Exam  Constitutional: She is oriented to person, place, and time. She appears well-developed and well-nourished. No distress.  HENT:  Head: Normocephalic and atraumatic.  Eyes:  EOM are normal.  Neck: Neck supple.  Pulmonary/Chest: Effort normal.  Musculoskeletal:  Right second toe with ecchymosis to the second phalanx and DIP. No deformity. Minimal swelling. Positive for direct tenderness. No tenderness to the surrounding toes, foot or ankle. Sensation intact.  Neurological: She is alert and oriented to person, place, and time. No cranial nerve deficit.  Skin: Skin is warm and dry.  Psychiatric: She has a normal mood and affect.  Nursing note and vitals reviewed.   Urgent Care Course     Procedures (including critical care time)  Labs Review Labs Reviewed - No data to display  Imaging Review Dg Foot Complete Right  Result Date: 04/03/2017 CLINICAL DATA:  Hit foot on door EXAM: RIGHT FOOT COMPLETE - 3+ VIEW COMPARISON:  None. FINDINGS: Frontal, oblique, and lateral views obtained. There is a linear sclerotic focus in the midportion of the second middle phalanx, felt to represent a subtle impaction type injury. No other fracture evident. No dislocation. Joint spaces appear normal. No erosive change. IMPRESSION: Linear transverse sclerotic area in the midportion of the second middle phalanx, felt to represent an impaction type fracture. No other fracture. No dislocation. No appreciable arthropathy. These results will be called to the ordering clinician or representative by the Radiologist Assistant, and communication documented in the PACS or zVision Dashboard. Electronically Signed   By: Bretta Bang III M.D.   On: 04/03/2017 14:56     Visual Acuity Review  Right Eye Distance:   Left Eye Distance:   Bilateral Distance:    Right Eye Near:   Left Eye Near:    Bilateral Near:         MDM   1. Closed fracture of phalanx of right second toe, initial encounter    Keep your toes taped together. Wear the hard sole shoe when walking. For the next to 3 days keep it elevated as much as she can and apply ice. Meds ordered this encounter  Medications  .  HYDROcodone-acetaminophen (NORCO/VICODIN) 5-325 MG tablet    Sig: Take 1 tablet by mouth every 4 (four) hours as needed.    Dispense:  12 tablet    Refill:  0    Order Specific Question:   Supervising Provider    Answer:   Elvina SidleLAUENSTEIN, KURT [[id number]]       Hayden RasmussenMabe, Tericka Devincenzi, NP 04/03/17 1556

## 2017-04-03 NOTE — ED Triage Notes (Signed)
inj     r   2   nd  Toe      Stubbed  It  On  A   Wall       pt  On  Weight  Bearing

## 2017-09-14 NOTE — Progress Notes (Deleted)
Psychiatric Initial Adult Assessment   Patient Identification: Heather Cuevas MRN:  [id number] Date of Evaluation:  09/14/2017 Referral Source: *** Chief Complaint:   Visit Diagnosis: No diagnosis found.  History of Present Illness:   Heather AsalHeather M Cuevas is a 41 year old female with   Associated Signs/Symptoms: Depression Symptoms:  {DEPRESSION SYMPTOMS:20000} (Hypo) Manic Symptoms:  {BHH MANIC SYMPTOMS:22872} Anxiety Symptoms:  {BHH ANXIETY SYMPTOMS:22873} Psychotic Symptoms:  {BHH PSYCHOTIC SYMPTOMS:22874} PTSD Symptoms: {BHH PTSD SYMPTOMS:22875}  Past Psychiatric History:  Outpatient:  Psychiatry admission:  Previous suicide attempt:  Past trials of medication:  History of violence:   Previous Psychotropic Medications: {YES/NO:21197}  Substance Abuse History in the last 12 months:  {yes no:314532}  Consequences of Substance Abuse: {BHH CONSEQUENCES OF SUBSTANCE ABUSE:22880}  Past Medical History:  Past Medical History:  Diagnosis Date  . No significant past medical history    No past surgical history on file.  Family Psychiatric History: ***  Family History: No family history on file.  Social History:   Social History   Socioeconomic History  . Marital status: Single    Spouse name: Not on file  . Number of children: Not on file  . Years of education: Not on file  . Highest education level: Not on file  Social Needs  . Financial resource strain: Not on file  . Food insecurity - worry: Not on file  . Food insecurity - inability: Not on file  . Transportation needs - medical: Not on file  . Transportation needs - non-medical: Not on file  Occupational History  . Not on file  Tobacco Use  . Smoking status: Current Every Day Smoker  Substance and Sexual Activity  . Alcohol use: Yes  . Drug use: No  . Sexual activity: Not on file  Other Topics Concern  . Not on file  Social History Narrative  . Not on file    Additional Social History:  ***  Allergies:  No Known Allergies  Metabolic Disorder Labs: No results found for: HGBA1C, MPG No results found for: PROLACTIN No results found for: CHOL, TRIG, HDL, CHOLHDL, VLDL, LDLCALC   Current Medications: Current Outpatient Medications  Medication Sig Dispense Refill  . HYDROcodone-acetaminophen (NORCO/VICODIN) 5-325 MG tablet Take 1 tablet by mouth every 4 (four) hours as needed. 12 tablet 0   No current facility-administered medications for this visit.     Neurologic: Headache: No Seizure: No Paresthesias:No  Musculoskeletal: Strength & Muscle Tone: within normal limits Gait & Station: normal Patient leans: N/A  Psychiatric Specialty Exam: ROS  There were no vitals taken for this visit.There is no height or weight on file to calculate BMI.  General Appearance: Fairly Groomed  Eye Contact:  Good  Speech:  Clear and Coherent  Volume:  Normal  Mood:  {BHH MOOD:22306}  Affect:  {Affect (PAA):22687}  Thought Process:  Coherent and Goal Directed  Orientation:  Full (Time, Place, and Person)  Thought Content:  Logical  Suicidal Thoughts:  {ST/HT (PAA):22692}  Homicidal Thoughts:  {ST/HT (PAA):22692}  Memory:  Immediate;   Good Recent;   Good Remote;   Good  Judgement:  {Judgement (PAA):22694}  Insight:  {Insight (PAA):22695}  Psychomotor Activity:  Normal  Concentration:  Concentration: Good  Recall:  Good  Fund of Knowledge:Good  Language: Good  Akathisia:  No  Handed:  Right  AIMS (if indicated):  N/A  Assets:  Communication Skills Desire for Improvement  ADL's:  Intact  Cognition: WNL  Sleep:  ***  Assessment  Plan  The patient demonstrates the following risk factors for suicide: Chronic risk factors for suicide include: {Chronic Risk Factors for GMWNUUV:25366440}Suicide:30414011}. Acute risk factors for suicide include: {Acute Risk Factors for HKVQQVZ:56387564}Suicide:30414012}. Protective factors for this patient include: {Protective Factors for Suicide PPIR:51884166}Risk:30414013}.  Considering these factors, the overall suicide risk at this point appears to be {Desc; low/moderate/high:110033}. Patient {ACTION; IS/IS AYT:01601093}OT:21021397} appropriate for outpatient follow up.   Treatment Plan Summary: Plan as above   Neysa Hottereina Buffie Herne, MD 11/9/201810:32 AM

## 2017-09-20 ENCOUNTER — Ambulatory Visit (HOSPITAL_COMMUNITY): Payer: Worker's Compensation | Admitting: Psychiatry

## 2020-09-01 ENCOUNTER — Other Ambulatory Visit: Payer: Self-pay | Admitting: Physician Assistant

## 2020-09-01 DIAGNOSIS — Z1231 Encounter for screening mammogram for malignant neoplasm of breast: Secondary | ICD-10-CM

## 2020-10-07 ENCOUNTER — Other Ambulatory Visit: Payer: Self-pay

## 2020-10-07 ENCOUNTER — Other Ambulatory Visit: Payer: Self-pay | Admitting: Physician Assistant

## 2020-10-07 ENCOUNTER — Ambulatory Visit
Admission: RE | Admit: 2020-10-07 | Discharge: 2020-10-07 | Disposition: A | Discharge status: Home / Self Care | Payer: Medicaid Other | Source: Ambulatory Visit | Attending: Physical Therapy | Admitting: Physical Therapy

## 2020-10-07 DIAGNOSIS — R52 Pain, unspecified: Secondary | ICD-10-CM

## 2020-11-15 ENCOUNTER — Other Ambulatory Visit: Payer: Self-pay

## 2020-11-15 ENCOUNTER — Ambulatory Visit
Admission: RE | Admit: 2020-11-15 | Discharge: 2020-11-15 | Disposition: A | Discharge status: Home / Self Care | Payer: Worker's Compensation | Source: Ambulatory Visit | Attending: *Deleted | Admitting: *Deleted

## 2020-11-15 DIAGNOSIS — Z1231 Encounter for screening mammogram for malignant neoplasm of breast: Secondary | ICD-10-CM

## 2020-11-16 ENCOUNTER — Other Ambulatory Visit: Payer: Self-pay | Admitting: *Deleted

## 2020-11-16 DIAGNOSIS — R928 Other abnormal and inconclusive findings on diagnostic imaging of breast: Secondary | ICD-10-CM

## 2020-12-06 ENCOUNTER — Other Ambulatory Visit: Payer: Self-pay | Admitting: Physician Assistant

## 2020-12-06 DIAGNOSIS — M47812 Spondylosis without myelopathy or radiculopathy, cervical region: Secondary | ICD-10-CM

## 2020-12-06 DIAGNOSIS — M5412 Radiculopathy, cervical region: Secondary | ICD-10-CM

## 2020-12-15 ENCOUNTER — Other Ambulatory Visit: Payer: Self-pay | Admitting: Physician Assistant

## 2020-12-15 DIAGNOSIS — R928 Other abnormal and inconclusive findings on diagnostic imaging of breast: Secondary | ICD-10-CM

## 2020-12-26 ENCOUNTER — Other Ambulatory Visit: Payer: Self-pay

## 2020-12-26 ENCOUNTER — Ambulatory Visit
Admission: RE | Admit: 2020-12-26 | Discharge: 2020-12-26 | Disposition: A | Discharge status: Home / Self Care | Payer: Medicaid Other | Source: Ambulatory Visit | Attending: Physician Assistant | Admitting: Physician Assistant

## 2020-12-26 DIAGNOSIS — M5412 Radiculopathy, cervical region: Secondary | ICD-10-CM

## 2020-12-26 DIAGNOSIS — M47812 Spondylosis without myelopathy or radiculopathy, cervical region: Secondary | ICD-10-CM

## 2020-12-29 ENCOUNTER — Ambulatory Visit
Admission: RE | Admit: 2020-12-29 | Discharge: 2020-12-29 | Disposition: A | Discharge status: Home / Self Care | Payer: Medicaid Other | Source: Ambulatory Visit | Attending: *Deleted | Admitting: *Deleted

## 2020-12-29 ENCOUNTER — Ambulatory Visit: Payer: Medicaid Other

## 2020-12-29 ENCOUNTER — Ambulatory Visit
Admission: RE | Admit: 2020-12-29 | Discharge: 2020-12-29 | Disposition: A | Discharge status: Home / Self Care | Payer: Medicaid Other | Source: Ambulatory Visit | Attending: Physician Assistant | Admitting: Physician Assistant

## 2020-12-29 ENCOUNTER — Other Ambulatory Visit: Payer: Self-pay

## 2020-12-29 DIAGNOSIS — R928 Other abnormal and inconclusive findings on diagnostic imaging of breast: Secondary | ICD-10-CM

## 2021-03-16 ENCOUNTER — Ambulatory Visit (INDEPENDENT_AMBULATORY_CARE_PROVIDER_SITE_OTHER): Payer: Medicaid Other | Admitting: Neurology

## 2021-03-16 ENCOUNTER — Encounter: Payer: Self-pay | Admitting: Neurology

## 2021-03-16 ENCOUNTER — Other Ambulatory Visit: Payer: Self-pay

## 2021-03-16 VITALS — BP 114/61 | HR 91 | Ht 61.0 in | Wt 137.6 lb

## 2021-03-16 DIAGNOSIS — R202 Paresthesia of skin: Secondary | ICD-10-CM | POA: Diagnosis not present

## 2021-03-16 NOTE — Progress Notes (Signed)
Reason for visit: Paresthesias  Referring physician: Dr. Georgina Snell Heather Cuevas is a 45 y.o. female  History of present illness:  Heather Cuevas is a 45 year old right-handed white female with a history of anxiety and depression and a prior history of migraine headache who comes in for evaluation of paresthesias and tingling affecting the hands primarily.  The patient claims that she has a prior history of carpal tunnel syndrome on the right hand.  She noted onset of her symptoms in August 2021.  She began having some discomfort in the hands and tingling that would come and go, primarily in the evening hours and first thing in the morning.  She does not notice the symptoms as much when she is up and active.  She has been given a prescription for meloxicam and gabapentin which has helped the severity of the symptoms but this has not eliminated the symptoms.  She does have some neck pain off and on, she underwent MRI of the cervical spine, it was unremarkable.  No source of the sensory symptoms was noted.  Occasionally with prolonged walking she might note tingling in the feet as well.  She denies any weakness.  Occasionally she might have a twitch in the hands off and on.  She may drop things from the hands.  She at times will have some lightheaded sensations when she stands up quickly and might have spots in the eyes and feel imbalanced with this.  Usually, her balance is normal.  She has not had any falls, she denies any issues controlling the bowels or the bladder.  Due to the above symptoms, she is referred to this office for further evaluation.  Past Medical History:  Diagnosis Date  . No significant past medical history     No past surgical history on file.  No family history on file.  Social history:  reports that she has been smoking. She does not have any smokeless tobacco history on file. She reports current alcohol use. She reports that she does not use drugs.  Medications:   Prior to Admission medications   Medication Sig Start Date End Date Taking? Authorizing Provider  HYDROcodone-acetaminophen (NORCO/VICODIN) 5-325 MG tablet Take 1 tablet by mouth every 4 (four) hours as needed. 04/03/17   Hayden Rasmussen, NP     No Known Allergies  ROS:  Out of a complete 14 system review of symptoms, the patient complains only of the following symptoms, and all other reviewed systems are negative.  Tingling in the hands and feet Occasional neck and spine pain  Blood pressure 114/61, pulse 91, height 5\' 1"  (1.549 m), weight 137 lb 9.6 oz (62.4 kg).  Physical Exam  General: The patient is alert and cooperative at the time of the examination.  Eyes: Pupils are equal, round, and reactive to light. Discs are flat bilaterally.  Neck: The neck is supple, no carotid bruits are noted.  Respiratory: The respiratory examination is clear.  Cardiovascular: The cardiovascular examination reveals a regular rate and rhythm, no obvious murmurs or rubs are noted.  Neuromuscular: Range of movement the cervical and lumbar spine is normal.  Skin: Extremities are without significant edema.  Neurologic Exam  Mental status: The patient is alert and oriented x 3 at the time of the examination. The patient has apparent normal recent and remote memory, with an apparently normal attention span and concentration ability.  Cranial nerves: Facial symmetry is present. There is good sensation of the face to pinprick and  soft touch bilaterally. The strength of the facial muscles and the muscles to head turning and shoulder shrug are normal bilaterally. Speech is well enunciated, no aphasia or dysarthria is noted. Extraocular movements are full. Visual fields are full. The tongue is midline, and the patient has symmetric elevation of the soft palate. No obvious hearing deficits are noted.  Motor: The motor testing reveals 5 over 5 strength of all 4 extremities. Good symmetric motor tone is noted  throughout.  Sensory: Sensory testing is intact to pinprick, soft touch, vibration sensation, and position sense on all 4 extremities. No evidence of extinction is noted.  Coordination: Cerebellar testing reveals good finger-nose-finger and heel-to-shin bilaterally.  Tinel's sign at the wrists are positive bilaterally.  Gait and station: Gait is normal. Tandem gait is normal. Romberg is negative. No drift is seen.  Reflexes: Deep tendon reflexes are symmetric and normal bilaterally. Toes are downgoing bilaterally.   MRI cervical 12/26/20:  IMPRESSION: No significant finding. No cause of the presenting symptoms is identified. No compressive canal or foraminal stenosis. No cord lesion. Minimal, non-compressive disc bulges at C3-4, C4-5 and C5-6.  * MRI scan images were reviewed online. I agree with the written report.    Assessment/Plan:  1.  Paresthesias, hands greater than feet  The clinical examination is relatively unremarkable but the patient does have Tinel's sign at the wrist bilaterally suggestive of carpal tunnel syndrome.  Carpal tunnel may cause tingling in the hands that is most prominent at night and in the morning as the patient describes.  The patient will be set up for blood work today to exclude a metabolic source of her sensory symptoms.  She will have nerve conduction studies on both arms and EMG on one arm.  She will follow-up for the above study.  Marlan Palau MD 03/16/2021 8:22 AM  [facility] [address] [place], [place] [address]  Phone [phone] Fax [phone]

## 2021-03-17 LAB — VITAMIN B12: Vitamin B-12: 212 pg/mL — ABNORMAL LOW (ref 232–1245)

## 2021-03-21 LAB — RPR: RPR Ser Ql: NONREACTIVE

## 2021-03-21 LAB — SEDIMENTATION RATE: Sed Rate: 10 mm/hr (ref 0–32)

## 2021-03-21 LAB — COPPER, SERUM: Copper: 126 ug/dL (ref 80–158)

## 2021-03-21 LAB — HIV ANTIBODY (ROUTINE TESTING W REFLEX): HIV Screen 4th Generation wRfx: NONREACTIVE

## 2021-03-21 LAB — LYME DISEASE SEROLOGY W/REFLEX: Lyme Total Antibody EIA: NEGATIVE

## 2021-03-21 LAB — ANGIOTENSIN CONVERTING ENZYME: Angio Convert Enzyme: 57 U/L (ref 14–82)

## 2021-03-21 LAB — ANA W/REFLEX: Anti Nuclear Antibody (ANA): NEGATIVE

## 2021-03-22 ENCOUNTER — Telehealth: Payer: Self-pay | Admitting: Emergency Medicine

## 2021-03-22 NOTE — Telephone Encounter (Signed)
-----   Message from Charles K Willis, MD sent at 03/21/2021  4:31 PM EDT ----- Please call patient and have her come in for a vitamin B12 shot, then have her begin 1000 mcg tablets of vitamin B12.  Thank you. ----- Message ----- From: Interface, [facility] Lab Results In Sent: 03/17/2021   7:37 AM EDT To: Charles K Willis, MD   

## 2021-03-22 NOTE — Telephone Encounter (Signed)
LVM for patient to return call. 

## 2021-03-23 ENCOUNTER — Telehealth: Payer: Self-pay | Admitting: Emergency Medicine

## 2021-03-23 NOTE — Telephone Encounter (Signed)
-----   Message from York Spaniel, MD sent at 03/21/2021  4:31 PM EDT ----- Please call patient and have her come in for a vitamin B12 shot, then have her begin 1000 mcg tablets of vitamin B12.  Thank you. ----- Message ----- From: [facility] Lab Results In Sent: 03/17/2021   7:37 AM EDT To: York Spaniel, MD

## 2021-03-23 NOTE — Telephone Encounter (Signed)
Called and discussed Dr. Clarisa Kindred review and findings regarding patient's B12.  She is calling her PCP to have the injection for convience.  Patient denied further questions, verbalized understanding and expressed appreciation for the phone call.

## 2021-03-29 ENCOUNTER — Telehealth: Payer: Self-pay | Admitting: Neurology

## 2021-03-29 ENCOUNTER — Encounter: Payer: Self-pay | Admitting: Neurology

## 2021-03-29 ENCOUNTER — Encounter: Payer: Medicaid Other | Admitting: Neurology

## 2021-03-29 NOTE — Telephone Encounter (Signed)
This patient did not show for an EMG and nerve conduction study evaluation today. 

## 2021-04-03 ENCOUNTER — Other Ambulatory Visit: Payer: Self-pay

## 2021-04-03 ENCOUNTER — Encounter (HOSPITAL_COMMUNITY): Payer: Self-pay | Admitting: Emergency Medicine

## 2021-04-03 ENCOUNTER — Emergency Department (HOSPITAL_COMMUNITY)
Admission: EM | Admit: 2021-04-03 | Discharge: 2021-04-03 | Disposition: A | Discharge status: Home / Self Care | Payer: Medicaid Other | Attending: Emergency Medicine | Admitting: Emergency Medicine

## 2021-04-03 DIAGNOSIS — F17200 Nicotine dependence, unspecified, uncomplicated: Secondary | ICD-10-CM | POA: Diagnosis not present

## 2021-04-03 DIAGNOSIS — K0889 Other specified disorders of teeth and supporting structures: Secondary | ICD-10-CM | POA: Diagnosis present

## 2021-04-03 MED ORDER — AMOXICILLIN 500 MG PO CAPS: 500.0000 mg | ORAL_CAPSULE | Freq: Three times a day (TID) | ORAL | 0 refills | Status: DC

## 2021-04-03 NOTE — Discharge Instructions (Addendum)
Please see your dentist.  As we discussed if you do not get this tooth pulled or fix you will have another infection.  During the first 2 days you may get some slight facial swelling.  If you develop throat swelling, have a hard time swallowing, or have any other concerns please seek additional medical care.   Please take Ibuprofen (Advil, motrin) and Tylenol (acetaminophen) to relieve your pain.    You may take up to 600 MG (3 pills) of normal strength ibuprofen every 8 hours as needed.   You make take tylenol, up to 1,000 mg (two extra strength pills) every 8 hours as needed.   It is safe to take ibuprofen and tylenol at the same time as they work differently.   Do not take more than 3,000 mg tylenol in a 24 hour period (not more than one dose every 8 hours.  Please check all medication labels as many medications such as pain and cold medications may contain tylenol.  Do not drink alcohol while taking these medications.  Do not take other NSAID'S while taking ibuprofen (such as aleve or naproxen).  Please take ibuprofen with food to decrease stomach upset.  You may have diarrhea from the antibiotics.  It is very important that you continue to take the antibiotics even if you get diarrhea unless a medical professional tells you that you may stop taking them.  If you stop too early the bacteria you are being treated for will become stronger and you may need different, more powerful antibiotics that have more side effects and worsening diarrhea.  Please stay well hydrated and consider probiotics as they may decrease the severity of your diarrhea.  Please be aware that if you take any hormonal contraception (birth control pills, nexplanon, the ring, etc) that your birth control will not work while you are taking antibiotics and you need to use back up protection as directed on the birth control medication information insert.   While in the ED your blood pressure was high.  Please follow up with your  primary care doctor or the wellness clinic for repeat evaluation as you may need medication.  High blood pressure can cause long term, potentially serious, damage if left untreated.

## 2021-04-03 NOTE — ED Triage Notes (Signed)
C/o R upper dental pain with swelling since yesterday.

## 2021-04-03 NOTE — ED Provider Notes (Signed)
[facility] EMERGENCY DEPARTMENT Provider Note   CSN: [id number] Arrival date & time: 04/03/21  1339     History Chief Complaint  Patient presents with  . Dental Pain    Heather Cuevas is a 45 y.o. female with a past medical history of anxiety, depression, who presents today for evaluation of right-sided dental pain.  Her pain started yesterday.  She felt like she had some mild swelling in the area, used an ice pack and after that her pain and swelling improved however she still has pain there. She states that the tooth that is hurting has been broken for about 10 years. She does have a dentist however as this started on the weekend she has not yet called them.  No fevers and otherwise she feels well.  HPI     Past Medical History:  Diagnosis Date  . Anxiety   . Depression   . Migraine   . No significant past medical history     Patient Active Problem List   Diagnosis Date Noted  . No significant past medical history     Past Surgical History:  Procedure Laterality Date  . TONSILLECTOMY       OB History   No obstetric history on file.     Family History  Problem Relation Age of Onset  . COPD Mother   . Heart disease Mother   . Peptic Ulcer Brother     Social History   Tobacco Use  . Smoking status: Current Every Day Smoker    Packs/day: 0.50  . Smokeless tobacco: Never Used  Substance Use Topics  . Alcohol use: Yes  . Drug use: No    Home Medications Prior to Admission medications   Medication Sig Start Date End Date Taking? Authorizing Provider  amoxicillin (AMOXIL) 500 MG capsule Take 1 capsule (500 mg total) by mouth 3 (three) times daily. 04/03/21  Yes Cristina Gong, PA-C  buPROPion (WELLBUTRIN XL) 300 MG 24 hr tablet Take 300 mg by mouth daily.    [provider]  gabapentin (NEURONTIN) 300 MG capsule Take 300-600 mg by mouth 3 (three) times daily.    [provider]  ibuprofen (ADVIL) 200 MG tablet  Take 200 mg by mouth every 6 (six) hours as needed.    [provider]  meloxicam (MOBIC) 15 MG tablet Take 15 mg by mouth daily.    [provider]  PARoxetine (PAXIL) 20 MG tablet Take 20 mg by mouth daily.    [provider]  risperiDONE (RISPERDAL) 0.5 MG tablet Take 0.5 mg by mouth at bedtime.    [provider]    Allergies    Patient has no known allergies.  Review of Systems   Review of Systems  Constitutional: Negative for chills and fever.  HENT: Positive for dental problem. Negative for drooling, facial swelling (None currently), sore throat, trouble swallowing and voice change.   All other systems reviewed and are negative.   Physical Exam Updated Vital Signs BP (!) 151/80 (BP Location: Left Arm)   Pulse 91   Temp 98.9 F (37.2 C)   Resp 19   LMP 04/01/2021   SpO2 96%   Physical Exam Vitals and nursing note reviewed.  Constitutional:      General: She is not in acute distress. HENT:     Head: Normocephalic and atraumatic.     Comments: No obvious facial swelling.      Mouth/Throat:  Mouth: Mucous membranes are moist.     Comments: Teeth are in poor state with generalized decay.  The right maxillary second molar is broken.  No gingival edema.  No intraoral abscess.  No oropharyngeal edema.  Normal phonation. No elevation of the floor of the mouth or submandibular swelling.  Cardiovascular:     Rate and Rhythm: Normal rate.  Pulmonary:     Effort: Pulmonary effort is normal. No respiratory distress.  Musculoskeletal:     Cervical back: Neck supple. No rigidity.  Lymphadenopathy:     Cervical: No cervical adenopathy.  Neurological:     Mental Status: She is alert. Mental status is at baseline.     Comments: Awake and alert, answers all questions appropriately.  Speech is not slurred.    Psychiatric:        Mood and Affect: Mood normal.     ED Results / Procedures / Treatments   Labs (all labs ordered are listed, but  only abnormal results are displayed) Labs Reviewed - No data to display  EKG None  Radiology No results found.  Procedures Procedures   Medications Ordered in ED Medications - No data to display  ED Course  I have reviewed the triage vital signs and the nursing notes.  Pertinent labs & imaging results that were available during my care of the patient were reviewed by me and considered in my medical decision making (see chart for details).    MDM Rules/Calculators/A&P                         RAILEY GLAD is 45 year old woman who presents today for evaluation of dental pain that started yesterday. On my exam she does not have any evidence of spread of infection, including no trismus, elevation of the floor of the mouth, submandibular swelling.  Normal phonation.  No posterior oropharyngeal edema or discrete intraoral abscess. Teeth is in general poor state and broken teeth. She does not have any allergies, will give prescription for amoxicillin.  She is aware that she needs to follow-up with her dentist and have them treat the underlying cause early as this will recur.  She states she will call her dentist the next day they are open to schedule this.  She does not appear febrile here, does not meet Sirs/sepsis criteria.  Return precautions were discussed with patient who states their understanding.  At the time of discharge patient denied any unaddressed complaints or concerns.  Patient is agreeable for discharge home.  Note: Portions of this report may have been transcribed using voice recognition software. Every effort was made to ensure accuracy; however, inadvertent computerized transcription errors may be present  Final Clinical Impression(s) / ED Diagnoses Final diagnoses:  Pain, dental    Rx / DC Orders ED Discharge Orders         Ordered    amoxicillin (AMOXIL) 500 MG capsule  3 times daily        04/03/21 1529           Norman Clay 04/03/21  1537    Sabino Donovan, MD 04/03/21 [phone]

## 2021-04-03 NOTE — ED Provider Notes (Signed)
Emergency Medicine Provider Triage Evaluation Note  Heather Cuevas , a 45 y.o. female  was evaluated in triage.  Pt complains of dental pain.  Review of Systems  Positive: Dental pain Negative: Fever, sore throat  Physical Exam  BP (!) 151/80 (BP Location: Left Arm)   Pulse 91   Temp 98.9 F (37.2 C)   Resp 19   LMP 04/01/2021   SpO2 96%  Gen:   Awake, no distress   Resp:  Normal effort  MSK:   Moves extremities without difficulty  Other:  Dental decay with edema and tenderness to R upper molar  Medical Decision Making  Medically screening exam initiated at 2:10 PM.  Appropriate orders placed.  CHRISTA FASIG was informed that the remainder of the evaluation will be completed by another provider, this initial triage assessment does not replace that evaluation, and the importance of remaining in the ED until their evaluation is complete.  Dental pain x 1 day, likely 2/2 dental decay.   Fayrene Helper, PA-C 04/03/21 1412    Terrilee Files, MD 04/04/21 1011

## 2021-04-12 ENCOUNTER — Ambulatory Visit (INDEPENDENT_AMBULATORY_CARE_PROVIDER_SITE_OTHER): Payer: Medicaid Other | Admitting: Neurology

## 2021-04-12 ENCOUNTER — Ambulatory Visit: Payer: Medicaid Other | Admitting: Neurology

## 2021-04-12 ENCOUNTER — Encounter: Payer: Self-pay | Admitting: Neurology

## 2021-04-12 DIAGNOSIS — R202 Paresthesia of skin: Secondary | ICD-10-CM

## 2021-04-12 DIAGNOSIS — G5603 Carpal tunnel syndrome, bilateral upper limbs: Secondary | ICD-10-CM | POA: Diagnosis not present

## 2021-04-12 HISTORY — DX: Carpal tunnel syndrome, bilateral upper limbs: G56.03

## 2021-04-12 NOTE — Progress Notes (Addendum)
The patient comes to the office today for EMG and nerve conduction study evaluation.  Nerve conductions show evidence of bilateral carpal tunnel syndrome of moderate severity.  EMG of the left arm was unremarkable otherwise.  I will make a referral to Dr. Merlyn Lot for evaluation of the carpal tunnel syndrome.   MNC    Nerve / Sites Muscle Latency Ref. Amplitude Ref. Rel Amp Segments Distance Velocity Ref. Area    ms ms mV mV %  cm m/s m/s mVms  R Median - APB     Wrist APB 5.7 ?4.4 1.2 ?4.0 100 Wrist - APB 7   5.0     Upper arm APB 9.3  1.0  84.7 Upper arm - Wrist 19 53 ?49 4.3  L Median - APB     Wrist APB 9.2 ?4.4 2.5 ?4.0 100 Wrist - APB 7   8.7     Upper arm APB 6.9  2.7  108 Upper arm - Wrist 18 78 ?49 9.1     Ulnar Wrist APB 2.8  9.6  354 Ulnar Wrist - APB    24.4  R Ulnar - ADM     Wrist ADM 2.3 ?3.3 9.1 ?6.0 100 Wrist - ADM 7   29.9     B.Elbow ADM 5.0  8.1  88.9 B.Elbow - Wrist 18 66 ?49 28.8     A.Elbow ADM 6.6  8.0  98.3 A.Elbow - B.Elbow 10 64 ?49 28.6  L Ulnar - ADM     Wrist ADM 2.3 ?3.3 7.1 ?6.0 100 Wrist - ADM 7   25.8     B.Elbow ADM 5.0  6.4  90.2 B.Elbow - Wrist 17 64 ?49 25.0     A.Elbow ADM 6.6  6.0  94.3 A.Elbow - B.Elbow 10 62 ?49 24.8             SNC    Nerve / Sites Rec. Site Peak Lat Ref.  Amp Ref. Segments Distance    ms ms V V  cm  R Median - Orthodromic (Dig II, Mid palm)     Dig II Wrist NR ?3.4 NR ?10 Dig II - Wrist 13  L Median - Orthodromic (Dig II, Mid palm)     Dig II Wrist NR ?3.4 NR ?10 Dig II - Wrist 13  R Ulnar - Orthodromic, (Dig V, Mid palm)     Dig V Wrist 2.4 ?3.1 15 ?5 Dig V - Wrist 11  L Ulnar - Orthodromic, (Dig V, Mid palm)     Dig V Wrist 2.4 ?3.1 12 ?5 Dig V - Wrist 47              F  Wave    Nerve F Lat Ref.   ms ms  R Ulnar - ADM 24.3 ?32.0  L Ulnar - ADM 25.7 ?32.0

## 2021-04-12 NOTE — Progress Notes (Signed)
Please refer to EMG and nerve conduction procedure note.  

## 2021-04-12 NOTE — Procedures (Signed)
     HISTORY:  Heather Cuevas is a 45 year old patient with a history of bilateral hand numbness that will commonly wake her up at night.  She is dropping things from her hands.  She denies any significant neck pain or pain down the arms.  She is being evaluated for a possible neuropathy or a cervical radiculopathy.  NERVE CONDUCTION STUDIES:  Nerve conduction studies were performed on both upper extremities.  The distal motor latencies for the median nerves were prolonged bilaterally with low motor amplitudes seen for these nerves bilaterally.  The distal motor latencies and motor amplitudes for the ulnar nerves were normal bilaterally.  The nerve conduction velocities for the median and ulnar nerves were normal bilaterally.  The sensory latencies for the median nerves were unobtainable bilaterally, normal for the ulnar nerves bilaterally.  The F-wave latencies for the ulnar nerves were normal bilaterally.  EMG STUDIES:  EMG study was performed on the left upper extremity:  The first dorsal interosseous muscle reveals 2 to 4 K units with full recruitment. No fibrillations or positive waves were noted. The abductor pollicis brevis muscle reveals 2 to 4 K units with slightly reduced recruitment. No fibrillations or positive waves were noted. The extensor indicis proprius muscle reveals 1 to 3 K units with full recruitment. No fibrillations or positive waves were noted. The pronator teres muscle reveals 2 to 3 K units with full recruitment. No fibrillations or positive waves were noted. The biceps muscle reveals 1 to 2 K units with full recruitment. No fibrillations or positive waves were noted. The triceps muscle reveals 2 to 4 K units with full recruitment. No fibrillations or positive waves were noted. The anterior deltoid muscle reveals 2 to 3 K units with full recruitment. No fibrillations or positive waves were noted. The cervical paraspinal muscles were tested at 2 levels. No  abnormalities of insertional activity were seen at either level tested. There was good relaxation.   IMPRESSION:  Nerve conduction studies done on both upper extremities revealed evidence of bilateral carpal tunnel syndrome of moderate severity.  EMG evaluation of the left upper extremity was relatively unremarkable without evidence of an overlying cervical radiculopathy.  Marlan Palau MD 04/12/2021 3:26 PM  [facility] [address] [place], [place] [address]  Phone [phone] Fax [phone]

## 2021-04-13 ENCOUNTER — Telehealth: Payer: Self-pay | Admitting: Neurology

## 2021-04-13 NOTE — Telephone Encounter (Signed)
Faxed referral to Dr. Merlyn [facility] @ [facility]. Phone: [phone]. Fax: [phone].

## 2021-05-02 ENCOUNTER — Other Ambulatory Visit: Payer: Self-pay | Admitting: Orthopedic Surgery

## 2021-06-13 ENCOUNTER — Encounter (HOSPITAL_BASED_OUTPATIENT_CLINIC_OR_DEPARTMENT_OTHER): Payer: Self-pay | Admitting: Orthopedic Surgery

## 2021-06-13 ENCOUNTER — Other Ambulatory Visit: Payer: Self-pay

## 2021-06-16 ENCOUNTER — Telehealth: Payer: Self-pay | Admitting: *Deleted

## 2021-06-16 DIAGNOSIS — Z0289 Encounter for other administrative examinations: Secondary | ICD-10-CM

## 2021-06-16 NOTE — Telephone Encounter (Signed)
Received Heather Cuevas FMLA for patient. Will defer to Dr Heather Cuevas if this is appropriate for him to complete. Patient was referred to Dr Heather Cuevas, Hydrographic surveyor. She has scheduled right carpal tunnel surgery on 06/21/21.

## 2021-06-20 NOTE — Progress Notes (Signed)

## 2021-06-20 NOTE — Telephone Encounter (Signed)
Called patient to inquire purpose of FMLA. LVM advising her if the FMLA is specifically regarding recovery from CTS surgery, this should go to Dr. Merlyn Lot. Left # for call back.

## 2021-06-21 ENCOUNTER — Encounter (HOSPITAL_BASED_OUTPATIENT_CLINIC_OR_DEPARTMENT_OTHER)
Admission: RE | Disposition: A | Discharge status: Home / Self Care | Payer: Self-pay | Source: Home / Self Care | Attending: Orthopedic Surgery

## 2021-06-21 ENCOUNTER — Encounter (HOSPITAL_BASED_OUTPATIENT_CLINIC_OR_DEPARTMENT_OTHER): Payer: Self-pay | Admitting: Orthopedic Surgery

## 2021-06-21 ENCOUNTER — Ambulatory Visit (HOSPITAL_BASED_OUTPATIENT_CLINIC_OR_DEPARTMENT_OTHER): Payer: Medicaid Other | Admitting: Anesthesiology

## 2021-06-21 ENCOUNTER — Other Ambulatory Visit: Payer: Self-pay

## 2021-06-21 ENCOUNTER — Ambulatory Visit (HOSPITAL_BASED_OUTPATIENT_CLINIC_OR_DEPARTMENT_OTHER)
Admission: RE | Admit: 2021-06-21 | Discharge: 2021-06-21 | Disposition: A | Discharge status: Home / Self Care | Payer: Medicaid Other | Attending: Orthopedic Surgery | Admitting: Orthopedic Surgery

## 2021-06-21 DIAGNOSIS — F17210 Nicotine dependence, cigarettes, uncomplicated: Secondary | ICD-10-CM | POA: Diagnosis not present

## 2021-06-21 DIAGNOSIS — G5603 Carpal tunnel syndrome, bilateral upper limbs: Secondary | ICD-10-CM | POA: Diagnosis present

## 2021-06-21 DIAGNOSIS — Z888 Allergy status to other drugs, medicaments and biological substances status: Secondary | ICD-10-CM | POA: Diagnosis not present

## 2021-06-21 HISTORY — PX: CARPAL TUNNEL RELEASE: SHX101

## 2021-06-21 LAB — POCT PREGNANCY, URINE: Preg Test, Ur: NEGATIVE

## 2021-06-21 SURGERY — CARPAL TUNNEL RELEASE
Anesthesia: Regional | Site: Hand | Laterality: Right

## 2021-06-21 MED ORDER — TRAMADOL HCL 50 MG PO TABS: 50.0000 mg | ORAL_TABLET | Freq: Four times a day (QID) | ORAL | 0 refills | Status: DC | PRN

## 2021-06-21 MED ORDER — AMISULPRIDE (ANTIEMETIC) 5 MG/2ML IV SOLN: 10.0000 mg | Freq: Once | INTRAVENOUS | Status: DC | PRN

## 2021-06-21 MED ORDER — KETOROLAC TROMETHAMINE 30 MG/ML IJ SOLN: 30.0000 mg | Freq: Once | INTRAMUSCULAR | Status: DC | PRN

## 2021-06-21 MED ORDER — ACETAMINOPHEN 500 MG PO TABS
1000.0000 mg | ORAL_TABLET | Freq: Once | ORAL | Status: AC
  Administered 2021-06-21: 1000 mg via ORAL

## 2021-06-21 MED ORDER — BUPIVACAINE HCL (PF) 0.25 % IJ SOLN
INTRAMUSCULAR | Status: AC
  Filled 2021-06-21: qty 150

## 2021-06-21 MED ORDER — FENTANYL CITRATE (PF) 100 MCG/2ML IJ SOLN
INTRAMUSCULAR | Status: DC | PRN
  Administered 2021-06-21: 100 ug via INTRAVENOUS

## 2021-06-21 MED ORDER — ONDANSETRON HCL 4 MG/2ML IJ SOLN
INTRAMUSCULAR | Status: DC | PRN
Start: 2021-06-21 — End: 2021-06-21
  Administered 2021-06-21: 4 mg via INTRAVENOUS

## 2021-06-21 MED ORDER — PROPOFOL 10 MG/ML IV BOLUS
INTRAVENOUS | Status: DC | PRN
  Administered 2021-06-21 (×2): 20 mg via INTRAVENOUS
  Administered 2021-06-21 (×2): 10 mg via INTRAVENOUS

## 2021-06-21 MED ORDER — CEFAZOLIN SODIUM-DEXTROSE 2-4 GM/100ML-% IV SOLN
2.0000 g | INTRAVENOUS | Status: AC
  Administered 2021-06-21: 2 g via INTRAVENOUS

## 2021-06-21 MED ORDER — MIDAZOLAM HCL 2 MG/2ML IJ SOLN
INTRAMUSCULAR | Status: AC
  Filled 2021-06-21: qty 2

## 2021-06-21 MED ORDER — BUPIVACAINE HCL (PF) 0.25 % IJ SOLN
INTRAMUSCULAR | Status: DC | PRN
  Administered 2021-06-21: 7 mL

## 2021-06-21 MED ORDER — ONDANSETRON HCL 4 MG/2ML IJ SOLN
INTRAMUSCULAR | Status: AC
  Filled 2021-06-21: qty 2

## 2021-06-21 MED ORDER — LIDOCAINE HCL (PF) 0.5 % IJ SOLN
INTRAMUSCULAR | Status: DC | PRN
Start: 2021-06-21 — End: 2021-06-21
  Administered 2021-06-21: 30 mL via INTRAVENOUS

## 2021-06-21 MED ORDER — OXYCODONE HCL 5 MG PO TABS: 5.0000 mg | ORAL_TABLET | Freq: Once | ORAL | Status: DC | PRN

## 2021-06-21 MED ORDER — FENTANYL CITRATE (PF) 100 MCG/2ML IJ SOLN
INTRAMUSCULAR | Status: AC
  Filled 2021-06-21: qty 2

## 2021-06-21 MED ORDER — PROMETHAZINE HCL 25 MG/ML IJ SOLN: 6.2500 mg | INTRAMUSCULAR | Status: DC | PRN

## 2021-06-21 MED ORDER — FENTANYL CITRATE (PF) 100 MCG/2ML IJ SOLN: 25.0000 ug | INTRAMUSCULAR | Status: DC | PRN

## 2021-06-21 MED ORDER — ACETAMINOPHEN 500 MG PO TABS
ORAL_TABLET | ORAL | Status: AC
  Filled 2021-06-21: qty 2

## 2021-06-21 MED ORDER — OXYCODONE HCL 5 MG/5ML PO SOLN
5.0000 mg | Freq: Once | ORAL | Status: DC | PRN
Start: 2021-06-21 — End: 2021-06-21

## 2021-06-21 MED ORDER — LIDOCAINE HCL (CARDIAC) PF 100 MG/5ML IV SOSY
PREFILLED_SYRINGE | INTRAVENOUS | Status: DC | PRN
  Administered 2021-06-21: 40 mg via INTRAVENOUS

## 2021-06-21 MED ORDER — CEFAZOLIN SODIUM-DEXTROSE 2-4 GM/100ML-% IV SOLN
INTRAVENOUS | Status: AC
  Filled 2021-06-21: qty 100

## 2021-06-21 MED ORDER — 0.9 % SODIUM CHLORIDE (POUR BTL) OPTIME
TOPICAL | Status: DC | PRN
Start: 2021-06-21 — End: 2021-06-21
  Administered 2021-06-21: 100 mL

## 2021-06-21 MED ORDER — LACTATED RINGERS IV SOLN: INTRAVENOUS | Status: DC

## 2021-06-21 MED ORDER — LIDOCAINE HCL (PF) 2 % IJ SOLN
INTRAMUSCULAR | Status: AC
  Filled 2021-06-21: qty 5

## 2021-06-21 MED ORDER — MIDAZOLAM HCL 5 MG/5ML IJ SOLN
INTRAMUSCULAR | Status: DC | PRN
  Administered 2021-06-21: 2 mg via INTRAVENOUS

## 2021-06-21 SURGICAL SUPPLY — 37 items
APL PRP STRL LF DISP 70% ISPRP (MISCELLANEOUS) ×1
BLADE SURG 15 STRL LF DISP TIS (BLADE) ×1 IMPLANT
BLADE SURG 15 STRL SS (BLADE) ×2
BNDG CMPR 9X4 STRL LF SNTH (GAUZE/BANDAGES/DRESSINGS) ×1
BNDG COHESIVE 3X5 TAN STRL LF (GAUZE/BANDAGES/DRESSINGS) ×2 IMPLANT
BNDG ESMARK 4X9 LF (GAUZE/BANDAGES/DRESSINGS) ×1 IMPLANT
BNDG GAUZE ELAST 4 BULKY (GAUZE/BANDAGES/DRESSINGS) ×2 IMPLANT
CHLORAPREP W/TINT 26 (MISCELLANEOUS) ×2 IMPLANT
CORD BIPOLAR FORCEPS 12FT (ELECTRODE) ×2 IMPLANT
COVER BACK TABLE 60X90IN (DRAPES) ×2 IMPLANT
COVER MAYO STAND STRL (DRAPES) ×2 IMPLANT
CUFF TOURN SGL QUICK 18X4 (TOURNIQUET CUFF) ×2 IMPLANT
DRAPE EXTREMITY T 121X128X90 (DISPOSABLE) ×2 IMPLANT
DRAPE SURG 17X23 STRL (DRAPES) ×2 IMPLANT
DRSG PAD ABDOMINAL 8X10 ST (GAUZE/BANDAGES/DRESSINGS) ×2 IMPLANT
GAUZE 4X4 16PLY ~~LOC~~+RFID DBL (SPONGE) ×1 IMPLANT
GAUZE SPONGE 4X4 12PLY STRL (GAUZE/BANDAGES/DRESSINGS) ×2 IMPLANT
GAUZE XEROFORM 1X8 LF (GAUZE/BANDAGES/DRESSINGS) ×2 IMPLANT
GLOVE SURG ORTHO LTX SZ8 (GLOVE) ×2 IMPLANT
GLOVE SURG POLYISO LF SZ6.5 (GLOVE) ×1 IMPLANT
GLOVE SURG UNDER POLY LF SZ6.5 (GLOVE) ×1 IMPLANT
GLOVE SURG UNDER POLY LF SZ7 (GLOVE) ×1 IMPLANT
GLOVE SURG UNDER POLY LF SZ8.5 (GLOVE) ×2 IMPLANT
GOWN STRL REUS W/ TWL LRG LVL3 (GOWN DISPOSABLE) ×1 IMPLANT
GOWN STRL REUS W/TWL LRG LVL3 (GOWN DISPOSABLE) ×2
GOWN STRL REUS W/TWL XL LVL3 (GOWN DISPOSABLE) ×2 IMPLANT
NDL PRECISIONGLIDE 27X1.5 (NEEDLE) IMPLANT
NEEDLE PRECISIONGLIDE 27X1.5 (NEEDLE) ×2 IMPLANT
NS IRRIG 1000ML POUR BTL (IV SOLUTION) ×2 IMPLANT
PACK BASIN DAY SURGERY FS (CUSTOM PROCEDURE TRAY) ×2 IMPLANT
STOCKINETTE 4X48 STRL (DRAPES) ×2 IMPLANT
SUT ETHILON 4 0 PS 2 18 (SUTURE) ×2 IMPLANT
SUT VICRYL 4-0 PS2 18IN ABS (SUTURE) IMPLANT
SYR BULB EAR ULCER 3OZ GRN STR (SYRINGE) ×2 IMPLANT
SYR CONTROL 10ML LL (SYRINGE) ×1 IMPLANT
TOWEL GREEN STERILE FF (TOWEL DISPOSABLE) ×2 IMPLANT
UNDERPAD 30X36 HEAVY ABSORB (UNDERPADS AND DIAPERS) ×2 IMPLANT

## 2021-06-21 NOTE — Anesthesia Procedure Notes (Signed)
Anesthesia Regional Block: Bier block (IV Regional)   Pre-Anesthetic Checklist: , timeout performed,  Correct Patient, Correct Site, Correct Laterality,  Correct Procedure,, site marked,  Surgical consent,  At surgeon's request  Laterality: Right         Needles:  Injection technique: Single-shot  Needle Type: Other      Needle Gauge: 22     Additional Needles:   Procedures:,,,,, intact distal pulses, Esmarch exsanguination,  Single tourniquet utilized    Narrative:  Start time: 06/21/2021 8:48 AM End time: 06/21/2021 8:48 AM Injection made incrementally with aspirations every 30 mL.  Performed by: Personally

## 2021-06-21 NOTE — Anesthesia Postprocedure Evaluation (Signed)
Anesthesia Post Note  Patient: Heather Cuevas  Procedure(s) Performed: RIGHT CARPAL TUNNEL RELEASE (Right: Hand)     Patient location during evaluation: PACU Anesthesia Type: Bier Block Level of consciousness: awake and alert Pain management: pain level controlled Vital Signs Assessment: post-procedure vital signs reviewed and stable Respiratory status: spontaneous breathing, nonlabored ventilation and respiratory function stable Cardiovascular status: blood pressure returned to baseline and stable Postop Assessment: no apparent nausea or vomiting Anesthetic complications: no   No notable events documented.  Last Vitals:  Vitals:   06/21/21 0945 06/21/21 1000  BP: 128/84 127/80  Pulse: 72 66  Resp: 12 12  Temp:  36.4 C  SpO2: 96% 98%    Last Pain:  Vitals:   06/21/21 1000  TempSrc:   PainSc: 0-No pain                 Lowella Curb

## 2021-06-21 NOTE — Op Note (Signed)
NAME: Heather Cuevas MEDICAL RECORD NO: [id number] DATE OF BIRTH: 06/17/1976 FACILITY: [facility] LOCATION: [facility] PHYSICIAN: Nicki Reaper, MD   OPERATIVE REPORT   DATE OF PROCEDURE: 06/21/21    PREOPERATIVE DIAGNOSIS: Carpal Tunnel syndrome right hand   POSTOPERATIVE DIAGNOSIS: Same   PROCEDURE: Decompression median nerve right hand   SURGEON: Cindee Salt, M.D.   ASSISTANT: none   ANESTHESIA:  Bier block with sedation and Local   INTRAVENOUS FLUIDS:  Per anesthesia flow sheet.   ESTIMATED BLOOD LOSS:  Minimal.   COMPLICATIONS:  None.   SPECIMENS:  none   TOURNIQUET TIME:    Total Tourniquet Time Documented: Forearm (Right) - 22 minutes Total: Forearm (Right) - 22 minutes    DISPOSITION:  Stable to PACU.   INDICATIONS: Patient is a 45 year old female with a history of numbness and tingling nerve conductions positive she has elected undergo surgical release of the median nerve right hand.  Pre-.  Postoperative course been discussed along with risks and complications.  She is aware that there is no guarantee to the surgery the possibility of infection recurrence injury to arteries nerves tendons complete relief symptoms and dystrophy.  Preoperative area the patient is seen the extremity marked by both patient and surgeon antibiotic given  OPERATIVE COURSE: Patient is brought to the operating room placed in the supine position with the right arm free.  Forearm IV regional was carried out without difficulty under the direction the anesthesia department.  Prep was done with ChloraPrep a 3-minute dry time allowed timeout taken to confirm patient procedure.  A longitudinal incision was made the right palm carried down through subcutaneous tissue.  Bleeders were electrocauterized with bipolar..  Palmar fascia was split.  Superficial palmar arch was identified along with the flexor tendon to the ring little finger.  Retractors were placed retracting median nerve  flexor tendons radially and ulnar nerve ulnarly.  The flexor retinaculum was then released on its ulnar border.  A r sewell and sect C retractor were placed between skin and forearm fascia and the proximal aspect of flexor retinaculum distal forearm fascia was released for approximately 2 cm proximal to the wrist crease under direct vision.  Canal was explored.  Area compression of the nerve was apparent.  Motor branch entered into muscle distally.  The wound was copious irrigated with saline.  Skin was closed interrupted 4-0 nylon sutures.  Local infiltration quarter percent bupivacaine without epinephrine was given approximately 7 cc was used.  Sterile compressive dressing with fingers free was applied.  Deflation of the tourniquet all fingers immediately pink.  She was taken to the recovery room for observation in satisfactory condition.  She will be discharged home to return to the hand center [facility] in 1 week on Tylenol ibuprofen for pain with Ultram for breakthrough.   Cindee Salt, MD Electronically signed, 06/21/21

## 2021-06-21 NOTE — Transfer of Care (Signed)
Immediate Anesthesia Transfer of Care Note  Patient: Heather Cuevas  Procedure(s) Performed: RIGHT CARPAL TUNNEL RELEASE (Right: Hand)  Patient Location: PACU  Anesthesia Type:MAC and Bier block  Level of Consciousness: awake, alert  and oriented  Airway & Oxygen Therapy: Patient Spontanous Breathing  Post-op Assessment: Report given to RN and Post -op Vital signs reviewed and stable  Post vital signs: Reviewed and stable  Last Vitals:  Vitals Value Taken Time  BP    Temp    Pulse 84 06/21/21 0919  Resp    SpO2 99 % 06/21/21 0919  Vitals shown include unvalidated device data.  Last Pain:  Vitals:   06/21/21 0742  TempSrc: Oral  PainSc: 0-No pain         Complications: No notable events documented.

## 2021-06-21 NOTE — Brief Op Note (Signed)
06/21/2021  9:16 AM  PATIENT:  Heather Cuevas  45 y.o. female  PRE-OPERATIVE DIAGNOSIS:  RIGHT CARPAL TUNNEL SYNDROME  POST-OPERATIVE DIAGNOSIS:  RIGHT CARPAL TUNNEL SYNDROME  PROCEDURE:  Procedure(s) with comments: RIGHT CARPAL TUNNEL RELEASE (Right) - Bier block  SURGEON:  Surgeon(s) and Role:    * Cindee Salt, MD - Primary  PHYSICIAN ASSISTANT:   ASSISTANTS: none   ANESTHESIA:   local, regional, and IV sedation  EBL:  65ml  BLOOD ADMINISTERED:none  DRAINS: none   LOCAL MEDICATIONS USED:  BUPIVICAINE   SPECIMEN:  No Specimen  DISPOSITION OF SPECIMEN:  N/A  COUNTS:  YES  TOURNIQUET:   Total Tourniquet Time Documented: Forearm (Right) - 22 minutes Total: Forearm (Right) - 22 minutes   DICTATION: .Dragon Dictation  PLAN OF CARE: Discharge to home after PACU  PATIENT DISPOSITION:  PACU - hemodynamically stable.

## 2021-06-21 NOTE — H&P (Signed)
  Heather Cuevas is an 45 y.o. female.   Chief Complaint: numbness right hand HPI: Heather Cuevas is a 45 year old right hand dominant female referred by Dr. Anne Hahn for consultation regarding numbness and tingling both hands which began in September 2021. She states both hands are involved right greater than left. She is wearing 3 out of 7 nights she is dropping things. She has a prior history of a crush type injury to her right hand in 2002 between a wine rack and a closet. She has no history of injury to her neck. He has been taking meloxicam. She complains of numbness and tingling thumb to ring finger. She is not wearing a splint. She has had nerve conductions done by Dr. Anne Hahn who has referred her. She has no history of diabetes thyroid problems arthritis gout. Family history is positive diabetes and arthritis. She has not been tested is advised the importance of testing for diabetes. Her nerve conductions by Dr. Anne Hahn show a motor delay 5 7 on the right 9 2 on the left with no sensory response on either side. Past Medical History:  Diagnosis Date   Anxiety    Bilateral carpal tunnel syndrome 04/12/2021   Depression    Migraine    No significant past medical history     Past Surgical History:  Procedure Laterality Date   TONSILLECTOMY      Family History  Problem Relation Age of Onset   COPD Mother    Heart disease Mother    Peptic Ulcer Brother    Social History:  reports that she has been smoking cigarettes. She has been smoking an average of .5 packs per day. She has never used smokeless tobacco. She reports current alcohol use. She reports that she does not use drugs.  Allergies:  Allergies  Allergen Reactions   Buspar [Buspirone] Other (See Comments)    hallucinations    No medications prior to admission.    No results found for this or any previous visit (from the past 48 hour(s)).  No results found.   Pertinent items are noted in HPI.  Height 5\' 1"  (1.549 m), weight  63.5 kg, last menstrual period 06/01/2021.  General appearance: alert, cooperative, and appears stated age Head: Normocephalic, without obvious abnormality Neck: no JVD Resp: clear to auscultation bilaterally Cardio: regular rate and rhythm, S1, S2 normal, no murmur, click, rub or gallop GI: soft, non-tender; bowel sounds normal; no masses,  no organomegaly Extremities:  numbness right hand Pulses: 2+ and symmetric Skin: Skin color, texture, turgor normal. No rashes or lesions Neurologic: Grossly normal Incision/Wound: na  Assessment/Plan Diagnosed bilateral carpal tunnel syndrome Plan: We have discussed with her the Indications for surgical intervention with her. She has every indication to proceed with release of both carpal canals. Preperi-and postoperative course been discussed along with risk and complications. She is aware there is no guarantee to the surgery the possibility of infection recurrence injury to arteries nerves tendons incomplete relief of symptoms and dystrophy. She would like to proceed with a carpal tunnel release on her right side but has to check with her employer for time off. She will call back to schedule this can be scheduled as an outpatient under regional anesthesia.  06/03/2021 06/21/2021, 5:20 AM

## 2021-06-21 NOTE — Anesthesia Preprocedure Evaluation (Addendum)
Anesthesia Evaluation  Patient identified by MRN, date of birth, ID band Patient awake    Reviewed: Allergy & Precautions, NPO status , Patient's Chart, lab work & pertinent test results  Airway Mallampati: II  TM Distance: >3 FB Neck ROM: Full    Dental  (+) Poor Dentition, Chipped   Pulmonary Current SmokerPatient did not abstain from smoking.,    Pulmonary exam normal breath sounds clear to auscultation       Cardiovascular negative cardio ROS Normal cardiovascular exam Rhythm:Regular Rate:Normal     Neuro/Psych  Headaches, PSYCHIATRIC DISORDERS Anxiety Depression  Neuromuscular disease    GI/Hepatic negative GI ROS, Neg liver ROS,   Endo/Other  negative endocrine ROS  Renal/GU negative Renal ROS     Musculoskeletal negative musculoskeletal ROS (+)   Abdominal   Peds  Hematology negative hematology ROS (+)   Anesthesia Other Findings RIGHT CARPAL TUNNEL SYNDROME  Reproductive/Obstetrics                            Anesthesia Physical Anesthesia Plan  ASA: 2  Anesthesia Plan: Bier Block and Bier Block-LIDOCAINE ONLY   Post-op Pain Management:    Induction: Intravenous  PONV Risk Score and Plan: 1 and Ondansetron, Dexamethasone and Midazolam  Airway Management Planned: Simple Face Mask  Additional Equipment:   Intra-op Plan:   Post-operative Plan:   Informed Consent: I have reviewed the patients History and Physical, chart, labs and discussed the procedure including the risks, benefits and alternatives for the proposed anesthesia with the patient or authorized representative who has indicated his/her understanding and acceptance.     Dental advisory given  Plan Discussed with: CRNA  Anesthesia Plan Comments:        Anesthesia Quick Evaluation

## 2021-06-21 NOTE — Discharge Instructions (Addendum)
[facility] Instructions Hand Surgery  Wound Care: Keep your hand elevated above the level of your heart.  Do not allow it to dangle by your side.  Keep the dressing dry and do not remove it unless your doctor advises you to do so.  He will usually change it at the time of your post-op visit.  Moving your fingers is advised to stimulate circulation but will depend on the site of your surgery.  If you have a splint applied, your doctor will advise you regarding movement.  Activity: Do not drive or operate machinery today.  Rest today and then you may return to your normal activity and work as indicated by your physician.  Diet:  Drink liquids today or eat a light diet.  You may resume a regular diet tomorrow.    General expectations: Pain for two to three days. Fingers may become slightly swollen.  Call your doctor if any of the following occur: Severe pain not relieved by pain medication. Elevated temperature. Dressing soaked with blood. Inability to move fingers. White or bluish color to fingers.    Post Anesthesia Home Care Instructions  Activity: Get plenty of rest for the remainder of the day. A responsible individual must stay with you for 24 hours following the procedure.  For the next 24 hours, DO NOT: -Drive a car -Advertising copywriter -Drink alcoholic beverages -Take any medication unless instructed by your physician -Make any legal decisions or sign important papers.  Meals: Start with liquid foods such as gelatin or soup. Progress to regular foods as tolerated. Avoid greasy, spicy, heavy foods. If nausea and/or vomiting occur, drink only clear liquids until the nausea and/or vomiting subsides. Call your physician if vomiting continues.  Special Instructions/Symptoms: Your throat may feel dry or sore from the anesthesia or the breathing tube placed in your throat during surgery. If this causes discomfort, gargle with warm salt water. The discomfort should disappear  within 24 hours.  If you had a scopolamine patch placed behind your ear for the management of post- operative nausea and/or vomiting:  1. The medication in the patch is effective for 72 hours, after which it should be removed.  Wrap patch in a tissue and discard in the trash. Wash hands thoroughly with soap and water. 2. You may remove the patch earlier than 72 hours if you experience unpleasant side effects which may include dry mouth, dizziness or visual disturbances. 3. Avoid touching the patch. Wash your hands with soap and water after contact with the patch.     No Tylenol until after 1:45pm today.

## 2021-06-23 ENCOUNTER — Encounter (HOSPITAL_BASED_OUTPATIENT_CLINIC_OR_DEPARTMENT_OTHER): Payer: Self-pay | Admitting: Orthopedic Surgery

## 2021-06-29 NOTE — Telephone Encounter (Signed)
Patient picked up FMLA paperwork today and I sent a message to billing to reimburse the $50 fee.

## 2021-07-06 ENCOUNTER — Other Ambulatory Visit: Payer: Self-pay | Admitting: Orthopedic Surgery

## 2021-07-28 ENCOUNTER — Other Ambulatory Visit: Payer: Self-pay

## 2021-07-28 ENCOUNTER — Encounter (HOSPITAL_BASED_OUTPATIENT_CLINIC_OR_DEPARTMENT_OTHER): Payer: Self-pay | Admitting: Orthopedic Surgery

## 2021-08-01 NOTE — Progress Notes (Signed)

## 2021-08-02 ENCOUNTER — Encounter (HOSPITAL_BASED_OUTPATIENT_CLINIC_OR_DEPARTMENT_OTHER)
Admission: RE | Disposition: A | Discharge status: Home / Self Care | Payer: Self-pay | Source: Home / Self Care | Attending: Orthopedic Surgery

## 2021-08-02 ENCOUNTER — Ambulatory Visit (HOSPITAL_BASED_OUTPATIENT_CLINIC_OR_DEPARTMENT_OTHER): Payer: Medicaid Other | Admitting: Anesthesiology

## 2021-08-02 ENCOUNTER — Encounter (HOSPITAL_BASED_OUTPATIENT_CLINIC_OR_DEPARTMENT_OTHER): Payer: Self-pay | Admitting: Orthopedic Surgery

## 2021-08-02 ENCOUNTER — Other Ambulatory Visit: Payer: Self-pay

## 2021-08-02 ENCOUNTER — Ambulatory Visit (HOSPITAL_BASED_OUTPATIENT_CLINIC_OR_DEPARTMENT_OTHER)
Admission: RE | Admit: 2021-08-02 | Discharge: 2021-08-02 | Disposition: A | Discharge status: Home / Self Care | Payer: Medicaid Other | Attending: Orthopedic Surgery | Admitting: Orthopedic Surgery

## 2021-08-02 DIAGNOSIS — Z87828 Personal history of other (healed) physical injury and trauma: Secondary | ICD-10-CM | POA: Insufficient documentation

## 2021-08-02 DIAGNOSIS — G5602 Carpal tunnel syndrome, left upper limb: Secondary | ICD-10-CM | POA: Insufficient documentation

## 2021-08-02 DIAGNOSIS — F17210 Nicotine dependence, cigarettes, uncomplicated: Secondary | ICD-10-CM | POA: Diagnosis not present

## 2021-08-02 HISTORY — PX: CARPAL TUNNEL RELEASE: SHX101

## 2021-08-02 LAB — POCT PREGNANCY, URINE: Preg Test, Ur: NEGATIVE

## 2021-08-02 SURGERY — CARPAL TUNNEL RELEASE
Anesthesia: General | Site: Wrist | Laterality: Left

## 2021-08-02 MED ORDER — OXYCODONE HCL 5 MG PO TABS
ORAL_TABLET | ORAL | Status: AC
  Filled 2021-08-02: qty 1

## 2021-08-02 MED ORDER — FENTANYL CITRATE (PF) 100 MCG/2ML IJ SOLN
INTRAMUSCULAR | Status: AC
  Filled 2021-08-02: qty 2

## 2021-08-02 MED ORDER — LIDOCAINE HCL (CARDIAC) PF 100 MG/5ML IV SOSY
PREFILLED_SYRINGE | INTRAVENOUS | Status: DC | PRN
  Administered 2021-08-02: 60 mg via INTRAVENOUS

## 2021-08-02 MED ORDER — LACTATED RINGERS IV SOLN: INTRAVENOUS | Status: DC

## 2021-08-02 MED ORDER — BUPIVACAINE HCL (PF) 0.25 % IJ SOLN
INTRAMUSCULAR | Status: AC
  Filled 2021-08-02: qty 120

## 2021-08-02 MED ORDER — DEXAMETHASONE SODIUM PHOSPHATE 10 MG/ML IJ SOLN
INTRAMUSCULAR | Status: AC
  Filled 2021-08-02: qty 1

## 2021-08-02 MED ORDER — MIDAZOLAM HCL 2 MG/2ML IJ SOLN
INTRAMUSCULAR | Status: AC
  Filled 2021-08-02: qty 2

## 2021-08-02 MED ORDER — MIDAZOLAM HCL 5 MG/5ML IJ SOLN
INTRAMUSCULAR | Status: DC | PRN
  Administered 2021-08-02: 2 mg via INTRAVENOUS

## 2021-08-02 MED ORDER — DEXAMETHASONE SODIUM PHOSPHATE 10 MG/ML IJ SOLN
INTRAMUSCULAR | Status: DC | PRN
  Administered 2021-08-02: 5 mg via INTRAVENOUS

## 2021-08-02 MED ORDER — ACETAMINOPHEN 500 MG PO TABS
ORAL_TABLET | ORAL | Status: AC
  Filled 2021-08-02: qty 2

## 2021-08-02 MED ORDER — BUPIVACAINE HCL (PF) 0.25 % IJ SOLN
INTRAMUSCULAR | Status: DC | PRN
  Administered 2021-08-02: 7 mL

## 2021-08-02 MED ORDER — ONDANSETRON HCL 4 MG/2ML IJ SOLN
INTRAMUSCULAR | Status: AC
  Filled 2021-08-02: qty 2

## 2021-08-02 MED ORDER — OXYCODONE HCL 5 MG PO TABS
5.0000 mg | ORAL_TABLET | Freq: Once | ORAL | Status: AC
  Administered 2021-08-02: 5 mg via ORAL

## 2021-08-02 MED ORDER — TRAMADOL HCL 50 MG PO TABS: 50.0000 mg | ORAL_TABLET | Freq: Four times a day (QID) | ORAL | 0 refills | Status: DC | PRN

## 2021-08-02 MED ORDER — ACETAMINOPHEN 500 MG PO TABS
1000.0000 mg | ORAL_TABLET | Freq: Once | ORAL | Status: AC
  Administered 2021-08-02: 1000 mg via ORAL

## 2021-08-02 MED ORDER — CEFAZOLIN SODIUM-DEXTROSE 2-4 GM/100ML-% IV SOLN
INTRAVENOUS | Status: AC
  Filled 2021-08-02: qty 100

## 2021-08-02 MED ORDER — CEFAZOLIN SODIUM-DEXTROSE 2-4 GM/100ML-% IV SOLN
2.0000 g | INTRAVENOUS | Status: AC
Start: 2021-08-02 — End: 2021-08-02
  Administered 2021-08-02: 2 g via INTRAVENOUS

## 2021-08-02 MED ORDER — 0.9 % SODIUM CHLORIDE (POUR BTL) OPTIME
TOPICAL | Status: DC | PRN
  Administered 2021-08-02: 120 mL

## 2021-08-02 MED ORDER — FENTANYL CITRATE (PF) 100 MCG/2ML IJ SOLN
25.0000 ug | INTRAMUSCULAR | Status: DC | PRN
  Administered 2021-08-02: 25 ug via INTRAVENOUS

## 2021-08-02 MED ORDER — PROPOFOL 10 MG/ML IV BOLUS
INTRAVENOUS | Status: AC
  Filled 2021-08-02: qty 20

## 2021-08-02 MED ORDER — PROPOFOL 10 MG/ML IV BOLUS
INTRAVENOUS | Status: DC | PRN
Start: 2021-08-02 — End: 2021-08-02
  Administered 2021-08-02: 150 mg via INTRAVENOUS

## 2021-08-02 MED ORDER — FENTANYL CITRATE (PF) 100 MCG/2ML IJ SOLN
INTRAMUSCULAR | Status: DC | PRN
  Administered 2021-08-02: 25 ug via INTRAVENOUS
  Administered 2021-08-02: 50 ug via INTRAVENOUS

## 2021-08-02 MED ORDER — ONDANSETRON HCL 4 MG/2ML IJ SOLN
INTRAMUSCULAR | Status: DC | PRN
  Administered 2021-08-02: 4 mg via INTRAVENOUS

## 2021-08-02 SURGICAL SUPPLY — 35 items
APL PRP STRL LF DISP 70% ISPRP (MISCELLANEOUS) ×1
BLADE SURG 15 STRL LF DISP TIS (BLADE) ×1 IMPLANT
BLADE SURG 15 STRL SS (BLADE) ×2
BNDG CMPR 9X4 STRL LF SNTH (GAUZE/BANDAGES/DRESSINGS) ×1
BNDG COHESIVE 3X5 TAN ST LF (GAUZE/BANDAGES/DRESSINGS) ×2 IMPLANT
BNDG ESMARK 4X9 LF (GAUZE/BANDAGES/DRESSINGS) ×1 IMPLANT
BNDG GAUZE ELAST 4 BULKY (GAUZE/BANDAGES/DRESSINGS) ×2 IMPLANT
CHLORAPREP W/TINT 26 (MISCELLANEOUS) ×2 IMPLANT
CORD BIPOLAR FORCEPS 12FT (ELECTRODE) ×2 IMPLANT
COVER BACK TABLE 60X90IN (DRAPES) ×2 IMPLANT
COVER MAYO STAND STRL (DRAPES) ×2 IMPLANT
CUFF TOURN SGL QUICK 18X4 (TOURNIQUET CUFF) ×2 IMPLANT
DRAPE EXTREMITY T 121X128X90 (DISPOSABLE) ×2 IMPLANT
DRAPE SURG 17X23 STRL (DRAPES) ×2 IMPLANT
DRSG PAD ABDOMINAL 8X10 ST (GAUZE/BANDAGES/DRESSINGS) ×2 IMPLANT
GAUZE SPONGE 4X4 12PLY STRL (GAUZE/BANDAGES/DRESSINGS) ×2 IMPLANT
GAUZE XEROFORM 1X8 LF (GAUZE/BANDAGES/DRESSINGS) ×2 IMPLANT
GLOVE SURG ORTHO LTX SZ8 (GLOVE) ×2 IMPLANT
GLOVE SURG POLYISO LF SZ6.5 (GLOVE) ×1 IMPLANT
GLOVE SURG UNDER POLY LF SZ7 (GLOVE) ×1 IMPLANT
GLOVE SURG UNDER POLY LF SZ8.5 (GLOVE) ×2 IMPLANT
GOWN STRL REUS W/ TWL LRG LVL3 (GOWN DISPOSABLE) ×1 IMPLANT
GOWN STRL REUS W/TWL LRG LVL3 (GOWN DISPOSABLE) ×2
GOWN STRL REUS W/TWL XL LVL3 (GOWN DISPOSABLE) ×2 IMPLANT
NDL PRECISIONGLIDE 27X1.5 (NEEDLE) IMPLANT
NEEDLE PRECISIONGLIDE 27X1.5 (NEEDLE) ×2 IMPLANT
NS IRRIG 1000ML POUR BTL (IV SOLUTION) ×2 IMPLANT
PACK BASIN DAY SURGERY FS (CUSTOM PROCEDURE TRAY) ×2 IMPLANT
STOCKINETTE 4X48 STRL (DRAPES) ×2 IMPLANT
SUT ETHILON 4 0 PS 2 18 (SUTURE) ×2 IMPLANT
SUT VICRYL 4-0 PS2 18IN ABS (SUTURE) IMPLANT
SYR BULB EAR ULCER 3OZ GRN STR (SYRINGE) ×2 IMPLANT
SYR CONTROL 10ML LL (SYRINGE) ×1 IMPLANT
TOWEL GREEN STERILE FF (TOWEL DISPOSABLE) ×2 IMPLANT
UNDERPAD 30X36 HEAVY ABSORB (UNDERPADS AND DIAPERS) ×2 IMPLANT

## 2021-08-02 NOTE — Discharge Instructions (Addendum)
[facility] Instructions Hand Surgery  Wound Care: Keep your hand elevated above the level of your heart.  Do not allow it to dangle by your side.  Keep the dressing dry and do not remove it unless your doctor advises you to do so.  He will usually change it at the time of your post-op visit.  Moving your fingers is advised to stimulate circulation but will depend on the site of your surgery.  If you have a splint applied, your doctor will advise you regarding movement.  Activity: Do not drive or operate machinery today.  Rest today and then you may return to your normal activity and work as indicated by your physician.  Diet:  Drink liquids today or eat a light diet.  You may resume a regular diet tomorrow.    General expectations: Pain for two to three days. Fingers may become slightly swollen.  Call your doctor if any of the following occur: Severe pain not relieved by pain medication. Elevated temperature. Dressing soaked with blood. Inability to move fingers. White or bluish color to fingers.   Post Anesthesia Home Care Instructions  Activity: Get plenty of rest for the remainder of the day. A responsible individual must stay with you for 24 hours following the procedure.  For the next 24 hours, DO NOT: -Drive a car -Advertising copywriter -Drink alcoholic beverages -Take any medication unless instructed by your physician -Make any legal decisions or sign important papers.  Meals: Start with liquid foods such as gelatin or soup. Progress to regular foods as tolerated. Avoid greasy, spicy, heavy foods. If nausea and/or vomiting occur, drink only clear liquids until the nausea and/or vomiting subsides. Call your physician if vomiting continues.  Special Instructions/Symptoms: Your throat may feel dry or sore from the anesthesia or the breathing tube placed in your throat during surgery. If this causes discomfort, gargle with warm salt water. The discomfort should disappear within  24 hours.  If you had a scopolamine patch placed behind your ear for the management of post- operative nausea and/or vomiting:  1. The medication in the patch is effective for 72 hours, after which it should be removed.  Wrap patch in a tissue and discard in the trash. Wash hands thoroughly with soap and water. 2. You may remove the patch earlier than 72 hours if you experience unpleasant side effects which may include dry mouth, dizziness or visual disturbances. 3. Avoid touching the patch. Wash your hands with soap and water after contact with the patch.     No Tylenol until after 2:35pm today.

## 2021-08-02 NOTE — Anesthesia Preprocedure Evaluation (Addendum)
Anesthesia Evaluation  Patient identified by MRN, date of birth, ID band Patient awake    Reviewed: Allergy & Precautions, NPO status , Patient's Chart, lab work & pertinent test results  Airway Mallampati: II  TM Distance: >3 FB Neck ROM: Full    Dental  (+) Poor Dentition, Dental Advisory Given, Chipped,    Pulmonary Current SmokerPatient did not abstain from smoking.,    Pulmonary exam normal breath sounds clear to auscultation       Cardiovascular negative cardio ROS Normal cardiovascular exam Rhythm:Regular Rate:Normal     Neuro/Psych  Headaches, PSYCHIATRIC DISORDERS Anxiety Depression    GI/Hepatic negative GI ROS, Neg liver ROS,   Endo/Other  negative endocrine ROS  Renal/GU negative Renal ROS  negative genitourinary   Musculoskeletal negative musculoskeletal ROS (+)   Abdominal   Peds  Hematology negative hematology ROS (+)   Anesthesia Other Findings   Reproductive/Obstetrics                            Anesthesia Physical Anesthesia Plan  ASA: 2  Anesthesia Plan: General   Post-op Pain Management:    Induction: Intravenous  PONV Risk Score and Plan: 2 and Midazolam, Ondansetron and Dexamethasone  Airway Management Planned: LMA  Additional Equipment:   Intra-op Plan:   Post-operative Plan: Extubation in OR  Informed Consent: I have reviewed the patients History and Physical, chart, labs and discussed the procedure including the risks, benefits and alternatives for the proposed anesthesia with the patient or authorized representative who has indicated his/her understanding and acceptance.     Dental advisory given  Plan Discussed with: CRNA  Anesthesia Plan Comments:       Anesthesia Quick Evaluation

## 2021-08-02 NOTE — Anesthesia Postprocedure Evaluation (Signed)
Anesthesia Post Note  Patient: Heather Cuevas  Procedure(s) Performed: LEFT CARPAL TUNNEL RELEASE (Left: Wrist)     Patient location during evaluation: PACU Anesthesia Type: General Level of consciousness: awake and alert Pain management: pain level controlled Vital Signs Assessment: post-procedure vital signs reviewed and stable Respiratory status: spontaneous breathing, nonlabored ventilation, respiratory function stable and patient connected to nasal cannula oxygen Cardiovascular status: blood pressure returned to baseline and stable Postop Assessment: no apparent nausea or vomiting Anesthetic complications: no   No notable events documented.  Last Vitals:  Vitals:   08/02/21 1215 08/02/21 1230  BP: 131/80 125/81  Pulse: 73 81  Resp: 12 16  Temp:    SpO2: 100% 98%    Last Pain:  Vitals:   08/02/21 1227  TempSrc:   PainSc: 3                  [name] L Shakiyah Cirilo

## 2021-08-02 NOTE — Op Note (Signed)
NAME: Heather Cuevas MEDICAL RECORD NO: [id number] DATE OF BIRTH: [birth year] FACILITY: [facility] LOCATION: [facility] PHYSICIAN: Nicki Reaper, MD   OPERATIVE REPORT   DATE OF PROCEDURE: 08/02/21    PREOPERATIVE DIAGNOSIS: Carpal tunnel syndrome left   POSTOPERATIVE DIAGNOSIS: Same   PROCEDURE: Decompression median nerve left hand   SURGEON: Cindee Salt, M.D.   ASSISTANT: none   ANESTHESIA:  General and Local   INTRAVENOUS FLUIDS:  Per anesthesia flow sheet.   ESTIMATED BLOOD LOSS:  Minimal.   COMPLICATIONS:  None.   SPECIMENS:  none   TOURNIQUET TIME:    Total Tourniquet Time Documented: Upper Arm (Left) - 15 minutes Total: Upper Arm (Left) - 15 minutes    DISPOSITION:  Stable to PACU.   INDICATIONS: Patient is a 45 year old female with a history of numbness and tingling nonresponsive to conservative treatment nerve conductions reveal bilateral carpal tunnel syndrome.  She has undergone release of her right-sided was admitted now for release of the left carpal canal.  Pre-.  Postoperative Course been discussed along with risks and complications.  She is aware there is no guarantee to the surgery the possibility of infection recurrence injury to arteries nerves tendons incomplete relief symptoms and dystrophy.  Preoperative area the patient is seen extremity marked by both patient and surgeon antibiotic given  OPERATIVE COURSE: Patient is brought to the operating room where a general anesthetic was carried out without difficulty under the direction of the anesthesia department.  She was prepped with ChloraPrep.  3-minute dry time was allowed and timeout taken to confirm patient procedure.  The limb was exsanguinated with an Esmarch bandage turn placed on the arm was inflated to 250 mmHg.  A longitudinal incision was made left palm carried down through subcutaneous tissue.  Bleeders were electric rise with bipolar.  The palmar fascia was split.  The superficial  palmar arch along with flexor tendon the ring little finger were identified.  Retractors were placed retracting median nerve flexor tendons radially ulnarly of ulnarly.  The flexor retinaculum was then released on its ulnar border.  A right angle and stool retractor placed between skin and forearm fascia.  The deep structures were dissected free with blunt dissection.  The proximal aspect of flexor retinaculum distal forearm fascia was then released for approximately 3 cm proximal to the wrist crease under direct vision.  The canal was explored.  Motor branch entering the muscle distally.  Area compression of the nerve was apparent.  No further lesions were identified.  The wound was copious irrigated with saline.  The skin was closed interrupted 4-0 nylon sutures.  Local infiltration quarter percent bupivacaine without epinephrine was given approximately 8 cc was used.  A sterile compressive dressing with the fingers free was applied.  Deflation of the tourniquet all fingers immediately pink.  She was taken to the recovery room for observation in satisfactory condition.  She will be discharged home to return to the hand center [facility] in 1 week on Tylenol ibuprofen for pain with Ultram for breakthrough.   Cindee Salt, MD Electronically signed, 08/02/21

## 2021-08-02 NOTE — Brief Op Note (Signed)
08/02/2021  11:48 AM  PATIENT:  Heather Cuevas  45 y.o. female  PRE-OPERATIVE DIAGNOSIS:  LEFT CARPAL TUNNEL SYNDROME  POST-OPERATIVE DIAGNOSIS:  LEFT CARPAL TUNNEL SYNDROME  PROCEDURE:  Procedure(s): LEFT CARPAL TUNNEL RELEASE (Left)  SURGEON:  Surgeon(s) and Role:    * Cindee Salt, MD - Primary  PHYSICIAN ASSISTANT:   ASSISTANTS: none   ANESTHESIA:   local and general  EBL:  76ml  BLOOD ADMINISTERED:none  DRAINS: none   LOCAL MEDICATIONS USED:  MARCAINE     SPECIMEN:  No Specimen  DISPOSITION OF SPECIMEN:  N/A  COUNTS:  YES  TOURNIQUET:   Total Tourniquet Time Documented: Upper Arm (Left) - 15 minutes Total: Upper Arm (Left) - 15 minutes   DICTATION: .Reubin Milan Dictation  PLAN OF CARE: Discharge to home after PACU  PATIENT DISPOSITION:  PACU - hemodynamically stable.

## 2021-08-02 NOTE — Anesthesia Procedure Notes (Signed)
Procedure Name: LMA Insertion Date/Time: 08/02/2021 11:14 AM Performed by: Lauralyn Primes, CRNA Pre-anesthesia Checklist: Patient identified, Emergency Drugs available, Suction available and Patient being monitored Patient Re-evaluated:Patient Re-evaluated prior to induction Oxygen Delivery Method: Circle system utilized Preoxygenation: Pre-oxygenation with 100% oxygen Induction Type: IV induction Ventilation: Mask ventilation without difficulty LMA: LMA inserted LMA Size: 4.0 Number of attempts: 1 Airway Equipment and Method: Bite block Placement Confirmation: positive ETCO2 Tube secured with: Tape Dental Injury: Teeth and Oropharynx as per pre-operative assessment

## 2021-08-02 NOTE — Transfer of Care (Signed)
Immediate Anesthesia Transfer of Care Note  Patient: Heather Cuevas  Procedure(s) Performed: LEFT CARPAL TUNNEL RELEASE (Left: Wrist)  Patient Location: PACU  Anesthesia Type:General  Level of Consciousness: sedated  Airway & Oxygen Therapy: Patient Spontanous Breathing and Patient connected to face mask oxygen  Post-op Assessment: Report given to RN and Post -op Vital signs reviewed and stable  Post vital signs: Reviewed and stable  Last Vitals:  Vitals Value Taken Time  BP 121/80 08/02/21 1145  Temp    Pulse 76 08/02/21 1146  Resp 9 08/02/21 1146  SpO2 99 % 08/02/21 1146  Vitals shown include unvalidated device data.  Last Pain:  Vitals:   08/02/21 0831  TempSrc: Oral  PainSc: 0-No pain      Patients Stated Pain Goal: Other (Comment) (08/02/21 0831)  Complications: No notable events documented.

## 2021-08-02 NOTE — H&P (Signed)
  Heather Cuevas is an 45 y.o. female.   Chief Complaint: numbness left hand HPI: Heather Cuevas is a 45 year old right hand dominant female referred by Dr. Anne Hahn for consultation regarding numbness and tingling both hands which began in September 2021. She states both hands are involved right greater than left. She is wearing 3 out of 7 nights she is dropping things. She has a prior history of a crush type injury to her right hand in 2002 between a wine rack and a closet. She has no history of injury to her neck. He has been taking meloxicam. She complains of numbness and tingling thumb to ring finger. She is not wearing a splint. She has had nerve conductions done by Dr. Anne Hahn who has referred her. She has no history of diabetes thyroid problems arthritis gout. Family history is positive diabetes and arthritis. She has not been tested is advised the importance of testing for diabetes. . She is now 6 weeks following a right carpal tunnel release she would like to move forward with surgical intervention. Past Medical History:  Diagnosis Date   Anxiety    Bilateral carpal tunnel syndrome 04/12/2021   Depression    Migraine    No significant past medical history     Past Surgical History:  Procedure Laterality Date   CARPAL TUNNEL RELEASE Right 06/21/2021   Procedure: RIGHT CARPAL TUNNEL RELEASE;  Surgeon: Cindee Salt, MD;  Location: [facility];  Service: Orthopedics;  Laterality: Right;  Bier block   TONSILLECTOMY      Family History  Problem Relation Age of Onset   COPD Mother    Heart disease Mother    Peptic Ulcer Brother    Social History:  reports that she has been smoking cigarettes. She has been smoking an average of .5 packs per day. She has never used smokeless tobacco. She reports current alcohol use. She reports that she does not use drugs.  Allergies:  Allergies  Allergen Reactions   Buspar [Buspirone] Other (See Comments)    hallucinations    No medications  prior to admission.    No results found for this or any previous visit (from the past 48 hour(s)).  No results found.   Pertinent items are noted in HPI.  Height 5\' 1"  (1.549 m), weight 66.7 kg, last menstrual period 07/18/2021.  General appearance: alert, cooperative, and appears stated age Head: Normocephalic, without obvious abnormality Neck: no JVD Resp: clear to auscultation bilaterally Cardio: regular rate and rhythm, S1, S2 normal, no murmur, click, rub or gallop GI: soft, non-tender; bowel sounds normal; no masses,  no organomegaly Extremities: numbness left hand Pulses: 2+ and symmetric Skin: Skin color, texture, turgor normal. No rashes or lesions Neurologic: Grossly normal Incision/Wound: na  Assessment/Plan Carpal tunnel syndrome on the left wrist  Plan: We have discussed with her the Indications for surgical intervention with her.  Preperi-and postoperative course been discussed along with risk and complications. She is aware there is no guarantee to the surgery the possibility of infection recurrence injury to arteries nerves tendons incomplete relief of symptoms and dystrophy. She would like to proceed with a carpal tunnel release on her left side under regional anesthesia. 09/17/2021 08/02/2021, 5:21 AM

## 2021-08-04 ENCOUNTER — Encounter (HOSPITAL_BASED_OUTPATIENT_CLINIC_OR_DEPARTMENT_OTHER): Payer: Self-pay | Admitting: Orthopedic Surgery

## 2022-07-07 ENCOUNTER — Ambulatory Visit (HOSPITAL_COMMUNITY)
Admission: EM | Admit: 2022-07-07 | Discharge: 2022-07-07 | Disposition: A | Discharge status: Home / Self Care | Payer: Medicaid Other | Attending: Physician Assistant | Admitting: Physician Assistant

## 2022-07-07 ENCOUNTER — Encounter (HOSPITAL_COMMUNITY): Payer: Self-pay | Admitting: *Deleted

## 2022-07-07 DIAGNOSIS — J069 Acute upper respiratory infection, unspecified: Secondary | ICD-10-CM | POA: Insufficient documentation

## 2022-07-07 DIAGNOSIS — Z1152 Encounter for screening for COVID-19: Secondary | ICD-10-CM | POA: Insufficient documentation

## 2022-07-07 DIAGNOSIS — Z20822 Contact with and (suspected) exposure to covid-19: Secondary | ICD-10-CM | POA: Diagnosis not present

## 2022-07-07 NOTE — ED Provider Notes (Signed)
[facility]    CSN: [id number] Arrival date & time: 07/07/22  1229      History   Chief Complaint Chief Complaint  Patient presents with   Covid Exposure    HPI Heather Cuevas is a 46 y.o. female.   46 year old female presents for COVID testing.  Patient indicates that her daughter who lives with her tested positive for COVID 19 yesterday.  She relates she has been around her daughter in close contact for the past 3 days and also she accidentally was drinking the same drink as her daughter over the past 24 hours.  Patient indicates that she has felt fatigued, with some mild dizziness, and minimal upper respiratory symptoms.  She relates she has not had fever, chills or body aches.  She relates she has been vaccinated against COVID.  She is here today and desires a COVID test.     Past Medical History:  Diagnosis Date   Anxiety    Bilateral carpal tunnel syndrome 04/12/2021   Depression    Migraine    No significant past medical history     Patient Active Problem List   Diagnosis Date Noted   Bilateral carpal tunnel syndrome 04/12/2021   No significant past medical history     Past Surgical History:  Procedure Laterality Date   CARPAL TUNNEL RELEASE Right 06/21/2021   Procedure: RIGHT CARPAL TUNNEL RELEASE;  Surgeon: Cindee Salt, MD;  Location: [facility];  Service: Orthopedics;  Laterality: Right;  Bier block   CARPAL TUNNEL RELEASE Left 08/02/2021   Procedure: LEFT CARPAL TUNNEL RELEASE;  Surgeon: Cindee Salt, MD;  Location: [facility];  Service: Orthopedics;  Laterality: Left;   TONSILLECTOMY      OB History   No obstetric history on file.      Home Medications    Prior to Admission medications   Medication Sig Start Date End Date Taking? Authorizing Provider  buPROPion (WELLBUTRIN XL) 300 MG 24 hr tablet Take 300 mg by mouth daily.   Yes [provider]  cyclobenzaprine (FLEXERIL) 5 MG tablet Take 5 mg by  mouth 3 (three) times daily as needed for muscle spasms.   Yes [provider]  gabapentin (NEURONTIN) 300 MG capsule Take 300-600 mg by mouth 3 (three) times daily.   Yes [provider]  ibuprofen (ADVIL) 200 MG tablet Take 200 mg by mouth every 6 (six) hours as needed.   Yes [provider]  meloxicam (MOBIC) 15 MG tablet Take 15 mg by mouth daily.   Yes [provider]  risperiDONE (RISPERDAL) 0.5 MG tablet Take 0.5 mg by mouth at bedtime.   Yes [provider]  PARoxetine (PAXIL) 20 MG tablet Take 20 mg by mouth daily.    [provider]  traMADol (ULTRAM) 50 MG tablet Take 1 tablet (50 mg total) by mouth every 6 (six) hours as needed. 06/21/21   Cindee Salt, MD  traMADol (ULTRAM) 50 MG tablet Take 1 tablet (50 mg total) by mouth every 6 (six) hours as needed. 08/02/21   Cindee Salt, MD    Family History Family History  Problem Relation Age of Onset   COPD Mother    Heart disease Mother    Peptic Ulcer Brother     Social History Social History   Tobacco Use   Smoking status: Every Day    Packs/day: 0.50    Types: Cigarettes   Smokeless tobacco: Never  Substance Use Topics  Alcohol use: Yes   Drug use: No     Allergies   Buspar [buspirone]   Review of Systems Review of Systems  Constitutional:  Positive for fatigue.     Physical Exam Triage Vital Signs ED Triage Vitals  Enc Vitals Group     BP 07/07/22 1253 102/72     Pulse Rate 07/07/22 1253 94     Resp 07/07/22 1253 18     Temp 07/07/22 1253 99 F (37.2 C)     Temp Source 07/07/22 1253 Oral     SpO2 07/07/22 1253 95 %     Weight --      Height --      Head Circumference --      Peak Flow --      Pain Score 07/07/22 1252 0     Pain Loc --      Pain Edu? --      Excl. in GC? --    No data found.  Updated Vital Signs BP 102/72 (BP Location: Right Arm)   Pulse 94   Temp 99 F (37.2 C) (Oral)   Resp 18   LMP 06/23/2022 (Approximate)   SpO2 95%    Visual Acuity Right Eye Distance:   Left Eye Distance:   Bilateral Distance:    Right Eye Near:   Left Eye Near:    Bilateral Near:     Physical Exam Constitutional:      Appearance: Normal appearance.  HENT:     Right Ear: Tympanic membrane and ear canal normal.     Left Ear: Tympanic membrane and ear canal normal.     Mouth/Throat:     Mouth: Mucous membranes are moist.     Pharynx: Oropharynx is clear.  Cardiovascular:     Rate and Rhythm: Normal rate and regular rhythm.     Heart sounds: Normal heart sounds.  Pulmonary:     Effort: Pulmonary effort is normal.     Breath sounds: Normal breath sounds and air entry. No wheezing, rhonchi or rales.  Lymphadenopathy:     Cervical: No cervical adenopathy.  Neurological:     Mental Status: She is alert.      UC Treatments / Results  Labs (all labs ordered are listed, but only abnormal results are displayed) Labs Reviewed  SARS CORONAVIRUS 2 (TAT 6-24 HRS)    EKG   Radiology No results found.  Procedures Procedures (including critical care time)  Medications Ordered in UC Medications - No data to display  Initial Impression / Assessment and Plan / UC Course  I have reviewed the triage vital signs and the nursing notes.  Pertinent labs & imaging results that were available during my care of the patient were reviewed by me and considered in my medical decision making (see chart for details).    Plan: 1.  COVID test is pending. 2.  Advised to continue masking for the duration of symptoms and until she receives the results of the COVID test. 3.  Advised follow-up PCP or return to urgent care if symptoms fail to improve. Final Clinical Impressions(s) / UC Diagnoses   Final diagnoses:  Encounter for screening for COVID-19  Close exposure to COVID-19 virus  Acute upper respiratory infection     Discharge Instructions      COVID testing results will be completed in less than 24 hours.  If you do not get  a call from this office that indicates the test is negative or normal.  You can view the results by going on MyChart when they post in 24 hours. Advised for you to continue masking until you know the results of the COVID test and for several days thereafter until you are feeling normal. It is also advised for you to repeat the COVID test in the next 48 to 72 hours if you continue to have symptoms or start running fever. Follow-up PCP or return to urgent care if symptoms fail to improve.    ED Prescriptions   None    PDMP not reviewed this encounter.   Ellsworth Lennox, PA-C 07/07/22 1308

## 2022-07-07 NOTE — Discharge Instructions (Signed)
COVID testing results will be completed in less than 24 hours.  If you do not get a call from this office that indicates the test is negative or normal.  You can view the results by going on MyChart when they post in 24 hours. Advised for you to continue masking until you know the results of the COVID test and for several days thereafter until you are feeling normal. It is also advised for you to repeat the COVID test in the next 48 to 72 hours if you continue to have symptoms or start running fever. Follow-up PCP or return to urgent care if symptoms fail to improve.

## 2022-07-07 NOTE — ED Triage Notes (Signed)
Pts daughter tested positive yesterday and now she is feeling weak and wants tested.

## 2022-07-08 LAB — SARS CORONAVIRUS 2 (TAT 6-24 HRS): SARS Coronavirus 2: NEGATIVE

## 2022-10-17 IMAGING — CR DG CERVICAL SPINE COMPLETE 4+V
5 series · 5 of 5 positions shown · non-contrast
Comparison: 10/13/2009

CLINICAL DATA: Chronic neck and low back pain.  No injury.

EXAM:
CERVICAL SPINE - COMPLETE 4+ VIEW

[w cervical spine odontoid]
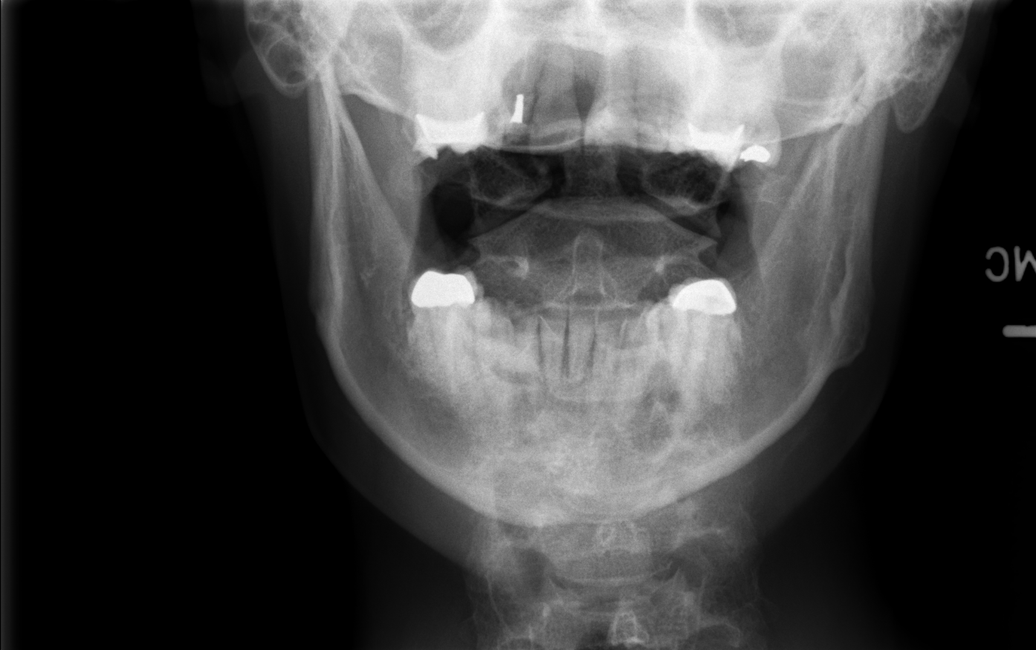

[w cervical spine ap]
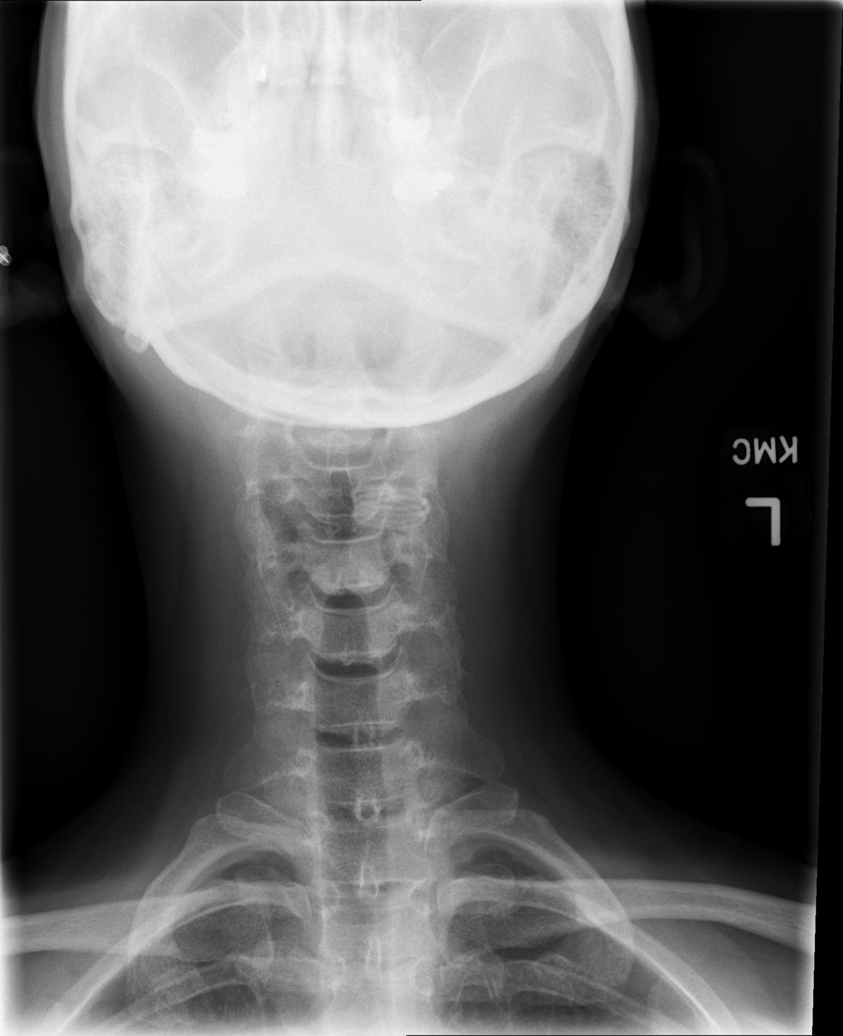

[w cervical spine lat]
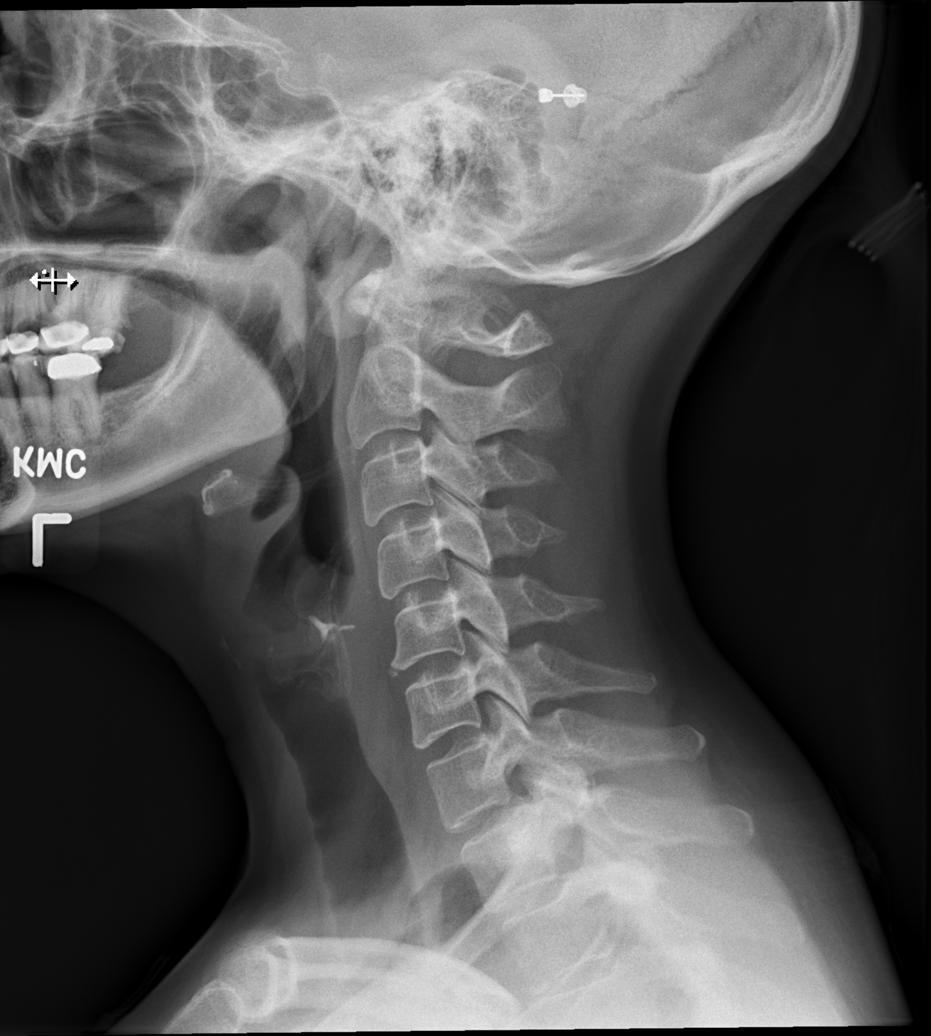

[w cervical spine ap_obl (1 of 2)]
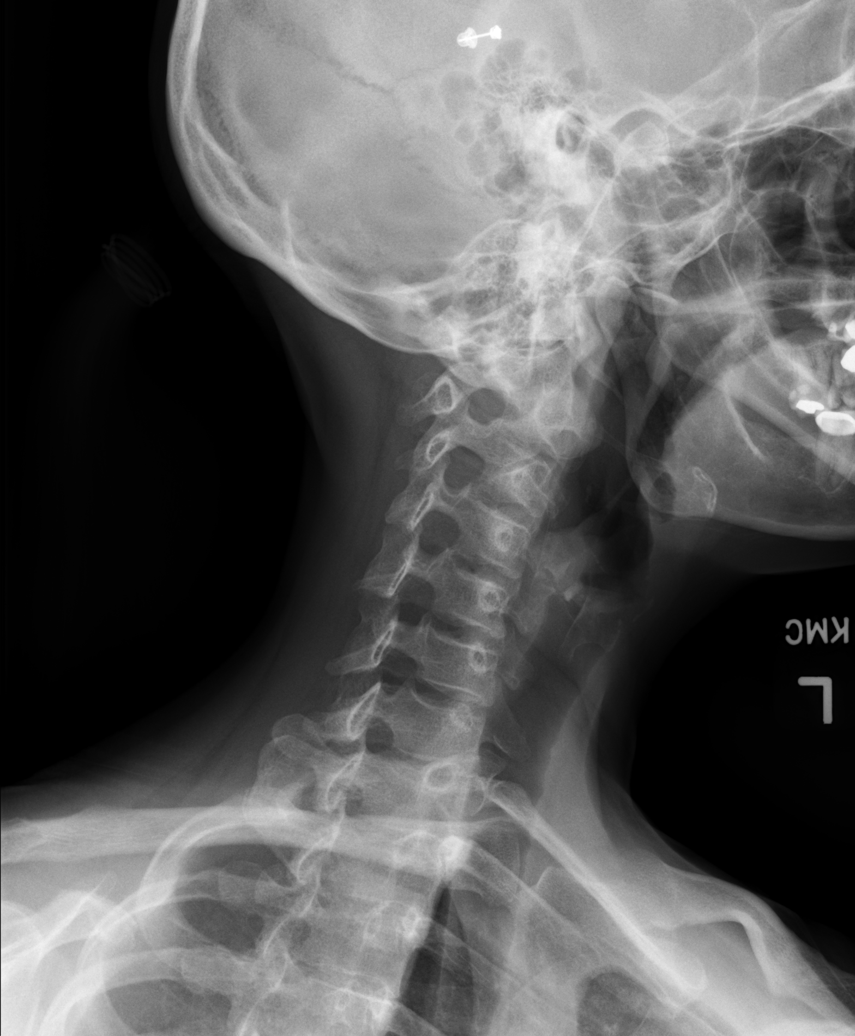

[w cervical spine ap_obl (2 of 2)]
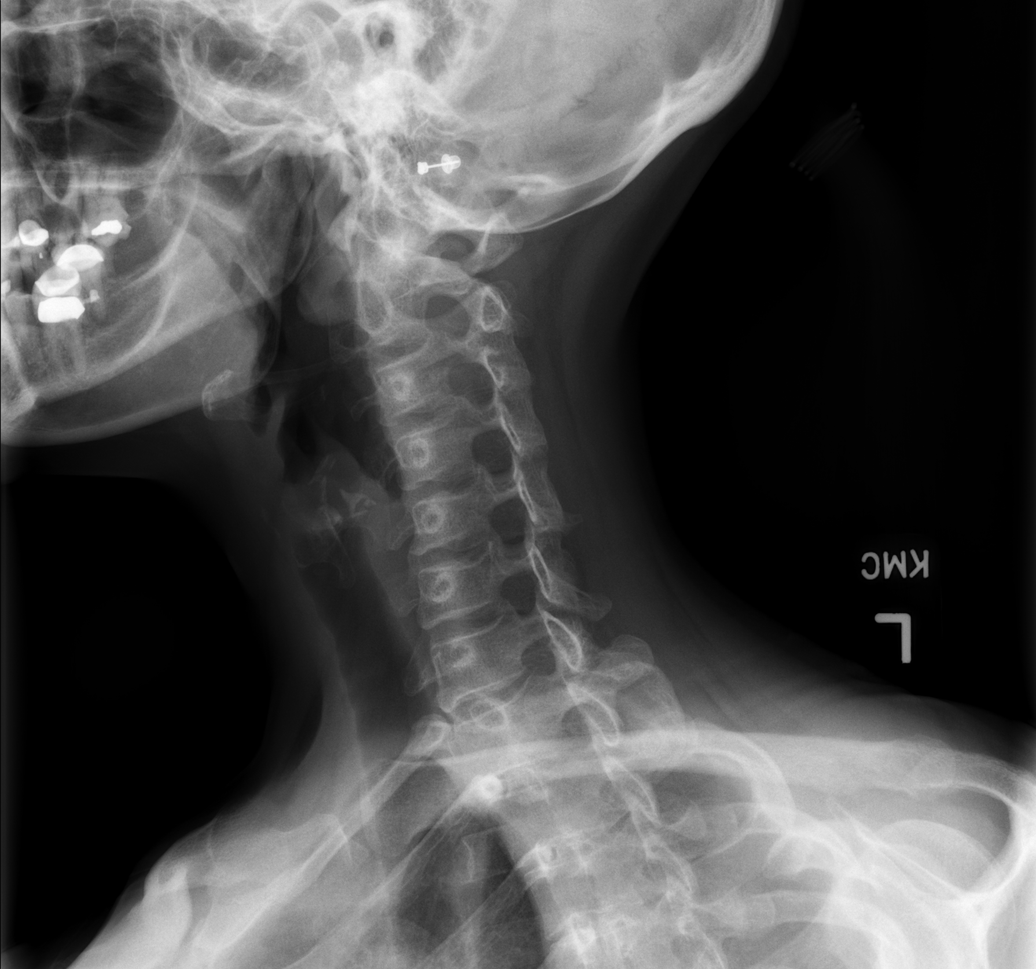

[5 of 5 positions shown; findings below may reference images not displayed]

FINDINGS: Vertebral body alignment, heights and disc space heights are normal.
There is minimal spondylosis of the mid to lower cervical spine to
include facet arthropathy over the C7-T1 and T1-T2 levels.
Prevertebral soft tissues and the atlantoaxial articulation are
normal. No significant neural foraminal narrowing.
IMPRESSION: 1. No acute findings.
2. Minimal spondylosis of the mid to lower cervical spine.

## 2024-05-17 ENCOUNTER — Encounter (HOSPITAL_COMMUNITY): Payer: Self-pay

## 2024-05-17 ENCOUNTER — Emergency Department (HOSPITAL_COMMUNITY)
Admission: EM | Admit: 2024-05-17 | Discharge: 2024-05-17 | Disposition: A | Discharge status: Home / Self Care | Attending: Emergency Medicine | Admitting: Emergency Medicine

## 2024-05-17 ENCOUNTER — Other Ambulatory Visit: Payer: Self-pay

## 2024-05-17 DIAGNOSIS — K0889 Other specified disorders of teeth and supporting structures: Secondary | ICD-10-CM | POA: Diagnosis present

## 2024-05-17 DIAGNOSIS — K047 Periapical abscess without sinus: Secondary | ICD-10-CM | POA: Insufficient documentation

## 2024-05-17 MED ORDER — CLINDAMYCIN HCL 300 MG PO CAPS: 300.0000 mg | ORAL_CAPSULE | Freq: Four times a day (QID) | ORAL | 0 refills | Status: AC

## 2024-05-17 MED ORDER — HYDROCODONE-ACETAMINOPHEN 5-325 MG PO TABS: 1.0000 | ORAL_TABLET | ORAL | 0 refills | Status: DC | PRN

## 2024-05-17 NOTE — ED Triage Notes (Signed)
 Patient states she has a toothache left upper back tooth , started having a small amt. Of swelling last pm put ice on her face and took motrin, this a left side of her face is swollen more under left eye

## 2024-05-17 NOTE — ED Provider Notes (Signed)
 [place] [facility] AT [facility] Provider Note   CSN: [id number] Arrival date & time: 05/17/24  9081     Patient presents with: Facial Pain   Heather Cuevas is a 48 y.o. female.   Patient complains of left-sided facial swelling.  Patient reports that she has a tooth that is infected.  Patient states she has not seen a dentist in a long time but she does have a dentist.  Patient is aware that she needs to have several broken teeth removed.  Patient reports she had a small area of swelling yesterday but today cheek is swollen.  She is not having any shortness of breath no fever no chills no difficulty swallowing.  Patient reports she is having pain.  She is here requesting antibiotics for infection.  The history is provided by the patient. No language interpreter was used.       Prior to Admission medications   Medication Sig Start Date End Date Taking? Authorizing Provider  clindamycin  (CLEOCIN ) 300 MG capsule Take 1 capsule (300 mg total) by mouth 4 (four) times daily for 10 days. 05/17/24 05/27/24 Yes Tamirra Sienkiewicz K, PA-C  HYDROcodone -acetaminophen  (NORCO/VICODIN) 5-325 MG tablet Take 1 tablet by mouth every 4 (four) hours as needed. 05/17/24 05/17/25 Yes Eliza Grissinger K, PA-C  buPROPion (WELLBUTRIN XL) 300 MG 24 hr tablet Take 300 mg by mouth daily.    [provider]  cyclobenzaprine (FLEXERIL) 5 MG tablet Take 5 mg by mouth 3 (three) times daily as needed for muscle spasms.    [provider]  gabapentin (NEURONTIN) 300 MG capsule Take 300-600 mg by mouth 3 (three) times daily.    [provider]  ibuprofen (ADVIL) 200 MG tablet Take 200 mg by mouth every 6 (six) hours as needed.    [provider]  meloxicam (MOBIC) 15 MG tablet Take 15 mg by mouth daily.    [provider]  PARoxetine (PAXIL) 20 MG tablet Take 20 mg by mouth daily.    [provider]  risperiDONE (RISPERDAL) 0.5 MG tablet Take 0.5 mg by  mouth at bedtime.    [provider]  traMADol  (ULTRAM ) 50 MG tablet Take 1 tablet (50 mg total) by mouth every 6 (six) hours as needed. 06/21/21   Murrell Kuba, MD  traMADol  (ULTRAM ) 50 MG tablet Take 1 tablet (50 mg total) by mouth every 6 (six) hours as needed. 08/02/21   Kuzma, Gary, MD    Allergies: Buspar [buspirone]    Review of Systems  All other systems reviewed and are negative.   Updated Vital Signs BP 127/86 (BP Location: Right Arm)   Pulse 97   Temp 98.6 F (37 C) (Oral)   Resp 16   Ht 5' 1 (1.549 m)   Wt 63.5 kg   SpO2 98%   BMI 26.45 kg/m   Physical Exam Vitals and nursing note reviewed.  Constitutional:      Appearance: She is well-developed.  HENT:     Head: Normocephalic.     Mouth/Throat:     Comments: Multiple broken teeth, swelling upper left gum Cardiovascular:     Rate and Rhythm: Normal rate.  Pulmonary:     Effort: Pulmonary effort is normal.  Abdominal:     General: There is no distension.  Musculoskeletal:        General: Normal range of motion.     Cervical back: Normal range of motion.  Skin:    General: Skin is  warm.  Neurological:     General: No focal deficit present.     Mental Status: She is alert and oriented to person, place, and time.     (all labs ordered are listed, but only abnormal results are displayed) Labs Reviewed - No data to display  EKG: None  Radiology: No results found.   Procedures   Medications Ordered in the ED - No data to display                                  Medical Decision Making Pt complains of dental pain and swelling,   Risk Prescription drug management. Risk Details: Pt given number for dentist.          Final diagnoses:  Dental abscess    ED Discharge Orders          Ordered    clindamycin  (CLEOCIN ) 300 MG capsule  4 times daily        05/17/24 1055    HYDROcodone -acetaminophen  (NORCO/VICODIN) 5-325 MG tablet  Every 4 hours PRN        05/17/24 1055            An After Visit Summary was printed and given to the patient.     Flint Sonny POUR, DEVONNA 05/17/24 1104    Doretha Folks, MD 05/18/24 [phone]

## 2024-05-17 NOTE — Discharge Instructions (Signed)
 Return if any problems.

## 2024-07-23 ENCOUNTER — Ambulatory Visit: Attending: Cardiology | Admitting: Cardiology

## 2024-07-23 ENCOUNTER — Encounter: Payer: Self-pay | Admitting: Cardiology

## 2024-07-23 VITALS — BP 115/71 | HR 70 | Ht 61.0 in | Wt 136.8 lb

## 2024-07-23 DIAGNOSIS — E785 Hyperlipidemia, unspecified: Secondary | ICD-10-CM | POA: Diagnosis present

## 2024-07-23 DIAGNOSIS — R600 Localized edema: Secondary | ICD-10-CM

## 2024-07-23 DIAGNOSIS — R079 Chest pain, unspecified: Secondary | ICD-10-CM | POA: Diagnosis not present

## 2024-07-23 DIAGNOSIS — R072 Precordial pain: Secondary | ICD-10-CM

## 2024-07-23 DIAGNOSIS — Z720 Tobacco use: Secondary | ICD-10-CM

## 2024-07-23 MED ORDER — METOPROLOL TARTRATE 100 MG PO TABS: ORAL_TABLET | ORAL | 0 refills | Status: DC

## 2024-07-23 NOTE — Patient Instructions (Signed)
 Medication Instructions:  Your physician recommends that you continue on your current medications as directed. Please refer to the Current Medication list given to you today.  *If you need a refill on your cardiac medications before your next appointment, please call your pharmacy*  Lab Work: TODAY- BMP, BNP If you have labs (blood work) drawn today and your tests are completely normal, you will receive your results only by: MyChart Message (if you have MyChart) OR A paper copy in the mail If you have any lab test that is abnormal or we need to change your treatment, we will call you to review the results.  Testing/Procedures:   Your cardiac CT will be scheduled at one of the below locations:   [facility] [address]  [place], [place] [address] [phone]   If scheduled at the Heart and Vascular Tower at [facility], please enter the parking lot using the [address] entrance and use the FREE valet service at the patient drop-off area. Enter the building and check-in with registration on the main floor.  Please follow these instructions carefully (unless otherwise directed):  An IV will be required for this test and Nitroglycerin will be given.  Hold all erectile dysfunction medications at least 3 days (72 hrs) prior to test. (Ie viagra, cialis, sildenafil, tadalafil, etc)   On the Night Before the Test: Be sure to Drink plenty of water. Do not consume any caffeinated/decaffeinated beverages or chocolate 12 hours prior to your test. Do not take any antihistamines 12 hours prior to your test.  If the patient has contrast allergy: Patient will need a prescription for Prednisone  and very clear instructions (as follows): Prednisone  50 mg - take 13 hours prior to test Take another Prednisone  50 mg 7 hours prior to test Take another Prednisone  50 mg 1 hour prior to test Take Benadryl  50 mg 1 hour prior to test Patient must complete all  four doses of above prophylactic medications. Patient will need a ride after test due to Benadryl .  On the Day of the Test: Drink plenty of water until 1 hour prior to the test. Do not eat any food 1 hour prior to test. You may take your regular medications prior to the test.  Take metoprolol  (Lopressor ) two hours prior to test. If you take Furosemide /Hydrochlorothiazide/Spironolactone/Chlorthalidone, please HOLD on the morning of the test. Patients who wear a continuous glucose monitor MUST remove the device prior to scanning. FEMALES- please wear underwire-free bra if available, avoid dresses & tight clothing  After the Test: Drink plenty of water. After receiving IV contrast, you may experience a mild flushed feeling. This is normal. On occasion, you may experience a mild rash up to 24 hours after the test. This is not dangerous. If this occurs, you can take Benadryl  25 mg, Zyrtec, Claritin, or Allegra and increase your fluid intake. (Patients taking Tikosyn should avoid Benadryl , and may take Zyrtec, Claritin, or Allegra) If you experience trouble breathing, this can be serious. If it is severe call 911 IMMEDIATELY. If it is mild, please call our office.  We will call to schedule your test 2-4 weeks out understanding that some insurance companies will need an authorization prior to the service being performed.   For more information and frequently asked questions, please visit our website : [email or web address]  For non-scheduling related questions, please contact the cardiac imaging nurse navigator should you have any questions/concerns: Cardiac Imaging Nurse Navigators Direct Office Dial: [phone]  For scheduling needs, including cancellations and rescheduling, please call [place], [phone].     Your physician has requested that you have an echocardiogram. Echocardiography is a painless test that uses sound waves to create images of your heart. It provides  your doctor with information about the size and shape of your heart and how well your heart's chambers and valves are working. This procedure takes approximately one hour. There are no restrictions for this procedure. Please do NOT wear cologne, perfume, aftershave, or lotions (deodorant is allowed). Please arrive 15 minutes prior to your appointment time.  Please note: We ask at that you not bring children with you during ultrasound (echo/ vascular) testing. Due to room size and safety concerns, children are not allowed in the ultrasound rooms during exams. Our front office staff cannot provide observation of children in our lobby area while testing is being conducted. An adult accompanying a patient to their appointment will only be allowed in the ultrasound room at the discretion of the ultrasound technician under special circumstances. We apologize for any inconvenience.   Follow-Up: At [facility], you and your health needs are our priority.  As part of our continuing mission to provide you with exceptional heart care, our providers are all part of one team.  This team includes your primary Cardiologist (physician) and Advanced Practice Providers or APPs (Physician Assistants and Nurse Practitioners) who all work together to provide you with the care you need, when you need it.  Your next appointment:   8 week(s)  Provider:   [name], DO

## 2024-07-23 NOTE — Progress Notes (Signed)
 Cardiology Office Note   Date:  07/23/2024  ID:  Heather Cuevas, DOB Jul 15, 1976, MRN [id number] PCP: Joshua Clayborne GORMAN Heather Cuevas  [facility] Providers Cardiologist:  None   History of Present Illness Heather Cuevas is a 48 y.o. female with a past medical history of HLD, IBS, depression, chronic pain. Presents today for evaluation of chest pain   On interview, patient reports over the past 3 weeks or so she has been having episodes of chest pain.  Chest pain occurs randomly but is often associated with emotional stress.  Chest pain is described as sharp and achy.  Normally located in one specific area of her left chest and does not radiate.  She notes having some tenderness to palpation, but the tenderness she has on palpation is different than the chest pain she has with emotional stress.  She has also been having issues with lower extremity swelling and shortness of breath/orthopnea.  Unsure if she has gained weight.  Her mother has a history of heart problems but she cannot recall exactly what she was diagnosed with.  She has been smoking cigarettes for 31 years, smoking between 1/2-1 1/2 packs/day.  She was recently started on cholesterol medications by her primary care provider. Denies dizziness, syncope, near syncope, palpitations.   Studies Reviewed EKG Interpretation Date/Time:  Wednesday July 23 2024 14:53:35 EDT Ventricular Rate:  70 PR Interval:  132 QRS Duration:  74 QT Interval:  390 QTC Calculation: 421 R Axis:   49  Text Interpretation: Normal sinus rhythm Low voltage QRS No previous ECGs available Confirmed by Vicci Sauer [id number]) on 07/23/2024 3:30:15 PM       Risk Assessment/Calculations           Physical Exam VS:  BP 115/71   Pulse 70   Ht 5' 1 (1.549 m)   Wt 136 lb 12.8 oz (62.1 kg)   SpO2 96%   BMI 25.85 kg/m        Wt Readings from Last 3 Encounters:  07/23/24 136 lb 12.8 oz (62.1 kg)  05/17/24 140 lb (63.5 kg)  08/02/21 154 lb 1.6 oz  (69.9 kg)    GEN: Well nourished, well developed in no acute distress NECK: No JVD; No carotid bruits CARDIAC:  RRR, no murmurs, rubs, gallops RESPIRATORY:  Clear to auscultation without rales, wheezing or rhonchi.  Normal work of breathing on room air ABDOMEN: Soft, non-tender, non-distended EXTREMITIES: 1+ edema in bilateral lower extremities; No deformity   ASSESSMENT AND PLAN  Chest pain  - Patient reports that she has been having chest pain intermittently for 3 weeks.  Pain seems to worsen when she is emotionally stressed.  Pain is located in the left side of her chest and does not radiate.  Does not seem to be exertional. Patient does have tenderness to palpation at a specific spot over her left chest, but this is different than the chest pain she has when she is emotionally stressed. - Patient has multiple risk factors for CAD including 31+ years of cigarette use, hyperlipidemia - EKG today without ischemic changes  - Ordered coronary CTA - Ordered BMP prior to scan - Ordered echocardiogram  Ankle edema  - Patient reports persistent lower extremity swelling.  Also reports some shortness of breath on exertion.  When she lays in bed at night, she can only lay on her back for a short period of time before she feels smothered.  Possible this represents orthopnea -Patient overall euvolemic on exam today,  though she does have 1+ edema in bilateral lower extremities -Ordered BNP, BMP -Pending lab results, consider starting Lasix  20 mg as needed for lower extremity swelling - Ordered echocardiogram  Tobacco use  - Patient reports that she has been smoking 0.5-1.5 packs per day for the past 31 years. -Counseled patient on tobacco cessation.  Reports that in the past she had some success with using nicotine patches.  Plans to retry nicotine patches  HLD  - Followed by PCP  - Reports that she was previously on statin therapy but it affected her liver  - Continue zetia 10 mg daily     Dispo: Follow up in 8 weeks with Dr. Michele   Signed, Rollo FABIENE Louder, PA-C

## 2024-07-24 LAB — BASIC METABOLIC PANEL WITH GFR
BUN/Creatinine Ratio: 8 — ABNORMAL LOW (ref 9–23)
BUN: 5 mg/dL — ABNORMAL LOW (ref 6–24)
CO2: 23 mmol/L (ref 20–29)
Calcium: 9.2 mg/dL (ref 8.7–10.2)
Chloride: 105 mmol/L (ref 96–106)
Creatinine, Ser: 0.62 mg/dL (ref 0.57–1.00)
Glucose: 72 mg/dL (ref 70–99)
Potassium: 4.6 mmol/L (ref 3.5–5.2)
Sodium: 141 mmol/L (ref 134–144)
eGFR: 110 mL/min/1.73 (ref >59)

## 2024-07-24 LAB — BRAIN NATRIURETIC PEPTIDE: BNP: 143.6 pg/mL — ABNORMAL HIGH (ref 0.0–100.0)

## 2024-07-25 ENCOUNTER — Ambulatory Visit: Payer: Self-pay | Admitting: Cardiology

## 2024-07-25 ENCOUNTER — Encounter (HOSPITAL_COMMUNITY): Payer: Self-pay

## 2024-07-25 ENCOUNTER — Other Ambulatory Visit: Payer: Self-pay

## 2024-07-25 MED ORDER — FUROSEMIDE 20 MG PO TABS: ORAL_TABLET | ORAL | 3 refills | Status: AC

## 2024-07-25 NOTE — Telephone Encounter (Signed)
 Pt returned call and was notified of lab results and recommendations. Discussed how to take Lasix  20 mg prn for swelling, weight gain of 3lb overnight. Pt voiced understanding.

## 2024-07-29 ENCOUNTER — Ambulatory Visit (HOSPITAL_COMMUNITY)
Admission: RE | Admit: 2024-07-29 | Discharge: 2024-07-29 | Disposition: A | Discharge status: Home / Self Care | Source: Ambulatory Visit | Attending: Cardiology | Admitting: Cardiology

## 2024-07-29 DIAGNOSIS — I2510 Atherosclerotic heart disease of native coronary artery without angina pectoris: Secondary | ICD-10-CM | POA: Insufficient documentation

## 2024-07-29 DIAGNOSIS — I700 Atherosclerosis of aorta: Secondary | ICD-10-CM | POA: Diagnosis not present

## 2024-07-29 DIAGNOSIS — Q2112 Patent foramen ovale: Secondary | ICD-10-CM | POA: Insufficient documentation

## 2024-07-29 DIAGNOSIS — R072 Precordial pain: Secondary | ICD-10-CM | POA: Insufficient documentation

## 2024-07-29 MED ORDER — IOHEXOL 350 MG/ML SOLN
100.0000 mL | Freq: Once | INTRAVENOUS | Status: AC | PRN
  Administered 2024-07-29: 100 mL via INTRAVENOUS

## 2024-07-29 MED ORDER — NITROGLYCERIN 0.4 MG SL SUBL
0.8000 mg | SUBLINGUAL_TABLET | Freq: Once | SUBLINGUAL | Status: AC
  Administered 2024-07-29: 0.8 mg via SUBLINGUAL

## 2024-07-30 ENCOUNTER — Ambulatory Visit (HOSPITAL_COMMUNITY)
Admission: RE | Admit: 2024-07-30 | Discharge: 2024-07-30 | Disposition: A | Discharge status: Home / Self Care | Source: Ambulatory Visit | Attending: Cardiology | Admitting: Cardiology

## 2024-07-30 ENCOUNTER — Other Ambulatory Visit: Payer: Self-pay | Admitting: Cardiology

## 2024-07-30 DIAGNOSIS — R931 Abnormal findings on diagnostic imaging of heart and coronary circulation: Secondary | ICD-10-CM | POA: Diagnosis present

## 2024-07-30 DIAGNOSIS — R072 Precordial pain: Secondary | ICD-10-CM | POA: Diagnosis present

## 2024-07-31 MED ORDER — METOPROLOL SUCCINATE ER 25 MG PO TB24: 25.0000 mg | ORAL_TABLET | Freq: Every day | ORAL | 3 refills | Status: AC

## 2024-07-31 MED ORDER — ASPIRIN 81 MG PO TBEC: 81.0000 mg | DELAYED_RELEASE_TABLET | Freq: Every day | ORAL | Status: AC

## 2024-07-31 NOTE — Addendum Note (Signed)
 Addended by: Vendetta Pittinger S on: 07/31/2024 03:53 PM   Modules accepted: Orders

## 2024-07-31 NOTE — Telephone Encounter (Signed)
 Spoke with pt regarding CTA results. Pt agreed to start ASA 81 mg daily and Toprol  XL 25 mg daily. Medication was sent to [facility] on [address]. Pt was advise that she needs a f/u appt with Dr. Michele to discuss further. Pt was schedule with Dr. Michele on Monday September 29th. Pt thanked me for the call and stated she had no further questions or concerns at this time.

## 2024-08-04 ENCOUNTER — Ambulatory Visit: Attending: Cardiology | Admitting: Cardiology

## 2024-08-04 ENCOUNTER — Encounter: Payer: Self-pay | Admitting: Cardiology

## 2024-08-04 VITALS — BP 104/78 | HR 81 | Resp 16 | Ht 61.0 in | Wt 134.0 lb

## 2024-08-04 DIAGNOSIS — I25118 Atherosclerotic heart disease of native coronary artery with other forms of angina pectoris: Secondary | ICD-10-CM | POA: Insufficient documentation

## 2024-08-04 DIAGNOSIS — F17210 Nicotine dependence, cigarettes, uncomplicated: Secondary | ICD-10-CM | POA: Insufficient documentation

## 2024-08-04 DIAGNOSIS — E782 Mixed hyperlipidemia: Secondary | ICD-10-CM | POA: Insufficient documentation

## 2024-08-04 DIAGNOSIS — R072 Precordial pain: Secondary | ICD-10-CM | POA: Diagnosis present

## 2024-08-04 MED ORDER — NITROGLYCERIN 0.4 MG SL SUBL: 0.4000 mg | SUBLINGUAL_TABLET | SUBLINGUAL | 3 refills | Status: AC | PRN

## 2024-08-04 MED ORDER — ATORVASTATIN CALCIUM 40 MG PO TABS: 40.0000 mg | ORAL_TABLET | Freq: Every day | ORAL | 3 refills | Status: AC

## 2024-08-04 NOTE — Patient Instructions (Signed)
 Medication Instructions:   START TAKING : ASPIRIN  81 MG ONCE A DAY   START TAKING :  TOPROL  XL   25 MG ONCE  A  DAY IN THE MORNING   START TAKING :  ATORVASTATIN 40 MG ONCE A DAY    START TAKING :   NITROGLYCERIN  0.4 MG AS NEEDED  FOR CHEST PAIN   *If you need a refill on your cardiac medications before your next appointment, please call your pharmacy*  Lab Work:   PLEASE GO DOWN STAIRS  [facility]  FIRST FLOOR   ( GET OFF ELEVATORS WALK TOWARDS WAITING AREA LAB LOCATED BY PHARMACY):  RETURN FOR FASTING  CMET AND LIPID     If you have labs (blood work) drawn today and your tests are completely normal, you will receive your results only by: MyChart Message (if you have MyChart) OR A paper copy in the mail If you have any lab test that is abnormal or we need to change your treatment, we will call you to review the results.   Testing/Procedures: NONE ORDERED  TODAY     Follow-Up: At [facility], you and your health needs are our priority.  As part of our continuing mission to provide you with exceptional heart care, our providers are all part of one team.  This team includes your primary Cardiologist (physician) and Advanced Practice Providers or APPs (Physician Assistants and Nurse Practitioners) who all work together to provide you with the care you need, when you need it.  Your next appointment:  AS SCHEDULED WITH DR . MICHELE  IN NOVEMBER    We recommend signing up for the patient portal called MyChart.  Sign up information is provided on this After Visit Summary.  MyChart is used to connect with patients for Virtual Visits (Telemedicine).  Patients are able to view lab/test results, encounter notes, upcoming appointments, etc.  Non-urgent messages can be sent to your provider as well.   To learn more about what you can do with MyChart, go to [email or web address].   Other Instructions

## 2024-08-04 NOTE — Progress Notes (Signed)
 Cardiology Office Note:  .   Date:  08/04/2024  ID:  Heather Cuevas, DOB [birth year], MRN [id number] PCP:  Joshua Clayborne [name]  Former Cardiology Providers: None [facility] Providers Cardiologist:  [name], DO , [facility] (established care 08/04/24) Electrophysiologist:  None  Click to update primary MD,subspecialty MD or APP then REFRESH:1}    Chief Complaint  Patient presents with   Chest Pain   Follow-up    History of Present Illness: .   Heather Cuevas is a 48 y.o. Caucasian female whose past medical history and cardiovascular risk factors includes: CAD, cigarette smoking HLD, IBS, depression, chronic pain.   Patient was last seen by Heather Cuevas 07/23/2024 for chest pain evaluation coronary CTA was recommended.  Noted to have CAD and and her appointment was moved up to discuss results and plan of care.  Prior to coronary CTA patient endorsed precordial pain ongoing for approximately 3 weeks, worse with emotional stress, left-sided, nonexertional.  Given her symptoms and risk factors coronary CTA was recommended.  Since her coronary CTA results patient was started on aspirin  and Toprol -XL by her providers.  Primary care started her on rosuvastatin 5 mg p.o. daily.  Patient has not started any pharmacological therapy.  She smokes up to 1.5 packs/day and also does regular marijuana.  Chest pain: Left-sided. Tightness like sensation. Brought on by emotional stress. Intensity 5 out of 10. Nonradiating. Nonexertional. Not relieved with resting. Self-limited Intermittent. Ongoing for 1 month No significant change since he last saw Ms. Heather Cuevas.  Review of Systems: .   Review of Systems  Cardiovascular:  Positive for chest pain (See HPI). Negative for claudication, irregular heartbeat, leg swelling, near-syncope, orthopnea, palpitations, paroxysmal nocturnal dyspnea and syncope.  Respiratory:  Negative for shortness of breath.   Hematologic/Lymphatic:  Negative for bleeding problem.    Studies Reviewed:   Echocardiogram: Tentatively scheduled for 08/28/2024  CCTA 07/29/2024 1. Coronary calcium score of 9.17. This was 94th percentile for age and sex matched control.   2. Normal coronary origins with right dominance.   3. CAD-RADS 3 Moderate CAD.   4. Mild stenosis (25-49%) due to soft plaque at the proximal/mid LAD.   5. Moderate stenosis (50-69%) due to soft plaque in the proximal/mid acute marginal branch.   6. CT FFR will be performed to further evaluate the acute marginal branch. Findings will be reported separately.   7. Aortic atherosclerosis.   8. Patent foramen ovale.  9.  Radiology overread: Pending  CTFFR 07/2024 1. CT FFR analysis showed no significant stenosis; however, mid and distal segment of the acute marginal are not modeled.   RECOMMENDATIONS: Guideline-directed medical therapy and aggressive risk factor modification for secondary prevention of coronary artery disease.   Optimize lipid-lowering therapy.   If refractory to medical therapy either consider cardiac PET-CT to evaluate for reversible ischemia or invasive angiography based on clinical trajectory. Clinical correlation required.  RADIOLOGY: N/A  Risk Assessment/Calculations:   NA   Labs:       10/25/2007    5:24 PM 10/25/2007    3:43 PM 09/15/2007    5:12 AM  CBC  WBC  10.2  34.1   Hemoglobin 12.9  12.3  9.6   Hematocrit 38.0  36.0  26.9   Platelets  411  279        Latest Ref Rng & Units 07/23/2024    4:03 PM 10/25/2007    5:24 PM  BMP  Glucose 70 - 99 mg/dL  72  75   BUN 6 - 24 mg/dL 5  3   Creatinine 9.42 - 1.00 mg/dL 9.37  0.8   BUN/Creat Ratio 9 - 23 8    Sodium 134 - 144 mmol/L 141  136   Potassium 3.5 - 5.2 mmol/L 4.6  4.2   Chloride 96 - 106 mmol/L 105  107   CO2 20 - 29 mmol/L 23    Calcium 8.7 - 10.2 mg/dL 9.2        Latest Ref Rng & Units 07/23/2024    4:03 PM 10/25/2007    5:24 PM  CMP  Glucose 70 - 99  mg/dL 72  75   BUN 6 - 24 mg/dL 5  3   Creatinine 9.42 - 1.00 mg/dL 9.37  0.8   Sodium 865 - 144 mmol/L 141  136   Potassium 3.5 - 5.2 mmol/L 4.6  4.2   Chloride 96 - 106 mmol/L 105  107   CO2 20 - 29 mmol/L 23    Calcium 8.7 - 10.2 mg/dL 9.2      No results found for: CHOL, HDL, LDLCALC, LDLDIRECT, TRIG, CHOLHDL No results for input(s): LIPOA in the last 8760 hours. No components found for: NTPROBNP No results for input(s): PROBNP in the last 8760 hours. No results for input(s): TSH in the last 8760 hours.  Physical Exam:    Today's Vitals   08/04/24 1357  BP: 104/78  Pulse: 81  Resp: 16  SpO2: 95%  Weight: 134 lb (60.8 kg)  Height: 5' 1 (1.549 m)   Body mass index is 25.32 kg/m. Wt Readings from Last 3 Encounters:  08/04/24 134 lb (60.8 kg)  07/23/24 136 lb 12.8 oz (62.1 kg)  05/17/24 140 lb (63.5 kg)    Physical Exam  Constitutional: No distress.  hemodynamically stable  Neck: No JVD present.  Cardiovascular: Normal rate, regular rhythm, S1 normal and S2 normal. Exam reveals no gallop, no S3 and no S4.  No murmur heard. Pulmonary/Chest: Effort normal and breath sounds normal. No stridor. She has no wheezes. She has no rales.  Musculoskeletal:        General: No edema.     Cervical back: Neck supple.  Skin: Skin is warm.     Impression & Recommendation(s):  Impression:   ICD-10-CM   1. Atherosclerosis of native coronary artery of native heart with other form of angina pectoris  I25.118 Comprehensive metabolic panel with GFR    Lipid panel    2. Precordial pain  R07.2     3. Mixed hyperlipidemia  E78.2     4. Cigarette smoker  F17.210        Recommendation(s):  Atherosclerosis of native coronary artery of native heart with other form of angina pectoris Precordial pain Endorses precordial pain suggestive of cardiac discomfort. Medication nave Has not started aspirin , Toprol -XL, statin therapy Reviewed the results of the  coronary CTA. No obstructive disease noted in LAD, LCx, RCA. Disease noted in acute marginal branch but the mid to distal vessel were not modeled therefore clinical significance unknown. We discussed uptitration of medical therapy and left heart catheterization with possible intervention.  After discussing the risks, benefits, alternatives, and limitations patient wants to hold off on the heart catheterization. She wants to focus on improving her modifiable cardiovascular risk factors as well as uptitration of medical therapy.  However, if she is refractory to such interventions she is willing to go forward with left heart catheterization with possible intervention.  In the interim, if she has worsening chest pain she is advised to go to the closest ER via EMS for further evaluation and management. Start aspirin  81 mg p.o. daily Start Toprol -XL 25 mg p.o. daily Sublingual nitroglycerin  tablets to use on as needed basis Baseline fasting lipids and CMP and then start Lipitor 40 mg p.o. nightly Reemphasized the importance of complete cessation of nicotine and marijuana Echocardiogram currently scheduled for 08/28/2024 I will see her back in close follow-up first week of November 2025 to reevaluate her symptoms and we discussed plan of care.   Mixed hyperlipidemia Was on Zetia in the past but discontinued secondary to lower extremity swelling Check fasting lipids and restart high intensity statin therapy  Cigarette smoker Tobacco cessation counseling: Currently smoking upto 1.5 packs/day   She is informed of the dangers of tobacco abuse including stroke, cancer, and MI, as well as benefits of tobacco cessation. She is willing to quit at this time. 7 mins were spent counseling patient cessation techniques. We discussed various methods to help quit smoking, including deciding on a date to quit, joining a support group, pharmacological agents- nicotine gum/patch/lozenges.  I will reassess her  progress at the next follow-up visit  Orders Placed:  Orders Placed This Encounter  Procedures   Comprehensive metabolic panel with GFR   Lipid panel    Final Medication List:    Meds ordered this encounter  Medications   atorvastatin (LIPITOR) 40 MG tablet    Sig: Take 1 tablet (40 mg total) by mouth daily.    Dispense:  90 tablet    Refill:  3   nitroGLYCERIN  (NITROSTAT ) 0.4 MG SL tablet    Sig: Place 1 tablet (0.4 mg total) under the tongue every 5 (five) minutes as needed for chest pain.    Dispense:  25 tablet    Refill:  3    Medications Discontinued During This Encounter  Medication Reason   metoprolol  tartrate (LOPRESSOR ) 100 MG tablet Completed Course     Current Outpatient Medications:    aspirin  EC 81 MG tablet, Take 1 tablet (81 mg total) by mouth daily. Swallow whole., Disp: , Rfl:    atorvastatin (LIPITOR) 40 MG tablet, Take 1 tablet (40 mg total) by mouth daily., Disp: 90 tablet, Rfl: 3   cyclobenzaprine (FLEXERIL) 5 MG tablet, Take 5 mg by mouth 3 (three) times daily as needed for muscle spasms., Disp: , Rfl:    ezetimibe (ZETIA) 10 MG tablet, Take 10 mg by mouth daily., Disp: , Rfl:    furosemide  (LASIX ) 20 MG tablet, Take 1 tablet daily as needed for swelling or weight gain of 3 lb over night, Disp: 90 tablet, Rfl: 3   gabapentin (NEURONTIN) 300 MG capsule, Take 300-600 mg by mouth 3 (three) times daily., Disp: , Rfl:    ibuprofen (ADVIL) 200 MG tablet, Take 200 mg by mouth every 6 (six) hours as needed., Disp: , Rfl:    meloxicam (MOBIC) 15 MG tablet, Take 15 mg by mouth daily., Disp: , Rfl:    metoprolol  succinate (TOPROL -XL) 25 MG 24 hr tablet, Take 1 tablet (25 mg total) by mouth daily., Disp: 90 tablet, Rfl: 3   nitroGLYCERIN  (NITROSTAT ) 0.4 MG SL tablet, Place 1 tablet (0.4 mg total) under the tongue every 5 (five) minutes as needed for chest pain., Disp: 25 tablet, Rfl: 3  Consent:   NA  Disposition:   November 2025, after her echocardiogram.    Her questions and concerns were addressed to  her satisfaction. She voices understanding of the recommendations provided during this encounter.    Signed, Madonna Michele HAS, [facility]  A Division of [facility] [address] [place], [place] [address]  08/04/2024 2:57 PM

## 2024-08-06 LAB — COMPREHENSIVE METABOLIC PANEL WITH GFR
ALT: 17 IU/L (ref 0–32)
AST: 22 IU/L (ref 0–40)
Albumin: 4.1 g/dL (ref 3.9–4.9)
Alkaline Phosphatase: 121 IU/L — ABNORMAL HIGH (ref 41–116)
BUN/Creatinine Ratio: 11 (ref 9–23)
BUN: 8 mg/dL (ref 6–24)
Bilirubin Total: 0.3 mg/dL (ref 0.0–1.2)
CO2: 25 mmol/L (ref 20–29)
Calcium: 9 mg/dL (ref 8.7–10.2)
Chloride: 99 mmol/L (ref 96–106)
Creatinine, Ser: 0.74 mg/dL (ref 0.57–1.00)
Globulin, Total: 2.5 g/dL (ref 1.5–4.5)
Glucose: 74 mg/dL (ref 70–99)
Potassium: 4.6 mmol/L (ref 3.5–5.2)
Sodium: 136 mmol/L (ref 134–144)
Total Protein: 6.6 g/dL (ref 6.0–8.5)
eGFR: 100 mL/min/1.73 (ref >59)

## 2024-08-06 LAB — LIPID PANEL
Chol/HDL Ratio: 7.7 ratio — ABNORMAL HIGH (ref 0.0–4.4)
Cholesterol, Total: 254 mg/dL — ABNORMAL HIGH (ref 100–199)
HDL: 33 mg/dL — ABNORMAL LOW (ref >39)
LDL Chol Calc (NIH): 179 mg/dL — ABNORMAL HIGH (ref 0–99)
Triglycerides: 218 mg/dL — ABNORMAL HIGH (ref 0–149)
VLDL Cholesterol Cal: 42 mg/dL — ABNORMAL HIGH (ref 5–40)

## 2024-08-07 ENCOUNTER — Ambulatory Visit: Payer: Self-pay | Admitting: Cardiology

## 2024-08-07 DIAGNOSIS — R931 Abnormal findings on diagnostic imaging of heart and coronary circulation: Secondary | ICD-10-CM

## 2024-08-07 DIAGNOSIS — E782 Mixed hyperlipidemia: Secondary | ICD-10-CM

## 2024-08-28 ENCOUNTER — Ambulatory Visit (HOSPITAL_COMMUNITY)

## 2024-09-23 ENCOUNTER — Ambulatory Visit: Attending: Cardiology | Admitting: Cardiology

## 2024-09-23 ENCOUNTER — Encounter: Payer: Self-pay | Admitting: Cardiology

## 2024-12-11 ENCOUNTER — Ambulatory Visit (HOSPITAL_COMMUNITY)
Admission: RE | Admit: 2024-12-11 | Discharge: 2024-12-11 | Disposition: A | Source: Ambulatory Visit | Attending: Cardiology | Admitting: Cardiology

## 2024-12-11 DIAGNOSIS — R079 Chest pain, unspecified: Secondary | ICD-10-CM

## 2024-12-11 DIAGNOSIS — R072 Precordial pain: Secondary | ICD-10-CM

## 2024-12-11 LAB — ECHOCARDIOGRAM COMPLETE
Area-P 1/2: 4.65 cm2
S' Lateral: 3.4 cm

## 2025-01-02 ENCOUNTER — Ambulatory Visit: Admitting: Cardiology
